# Patient Record
Sex: Male | Born: 1950
Health system: Southern US, Community
[De-identification: ages and names within clinical notes are randomized; demographics above are authoritative.]

## PROBLEM LIST (undated history)

## (undated) DIAGNOSIS — C801 Malignant (primary) neoplasm, unspecified: Secondary | ICD-10-CM

## (undated) DIAGNOSIS — G4730 Sleep apnea, unspecified: Secondary | ICD-10-CM

## (undated) DIAGNOSIS — K5790 Diverticulosis of intestine, part unspecified, without perforation or abscess without bleeding: Secondary | ICD-10-CM

## (undated) DIAGNOSIS — E785 Hyperlipidemia, unspecified: Secondary | ICD-10-CM

## (undated) DIAGNOSIS — N200 Calculus of kidney: Secondary | ICD-10-CM

## (undated) DIAGNOSIS — Z9989 Dependence on other enabling machines and devices: Secondary | ICD-10-CM

## (undated) DIAGNOSIS — M1990 Unspecified osteoarthritis, unspecified site: Secondary | ICD-10-CM

## (undated) DIAGNOSIS — H9190 Unspecified hearing loss, unspecified ear: Secondary | ICD-10-CM

## (undated) DIAGNOSIS — Z860101 Personal history of adenomatous and serrated colon polyps: Secondary | ICD-10-CM

## (undated) DIAGNOSIS — E119 Type 2 diabetes mellitus without complications: Secondary | ICD-10-CM

## (undated) DIAGNOSIS — F418 Other specified anxiety disorders: Secondary | ICD-10-CM

## (undated) DIAGNOSIS — K219 Gastro-esophageal reflux disease without esophagitis: Secondary | ICD-10-CM

## (undated) DIAGNOSIS — Z86010 Personal history of colonic polyps: Secondary | ICD-10-CM

## (undated) DIAGNOSIS — G4733 Obstructive sleep apnea (adult) (pediatric): Secondary | ICD-10-CM

## (undated) HISTORY — DX: Calculus of kidney: N20.0

## (undated) HISTORY — DX: Personal history of adenomatous and serrated colon polyps: Z86.0101

## (undated) HISTORY — DX: Hyperlipidemia, unspecified: E78.5

## (undated) HISTORY — DX: Obstructive sleep apnea (adult) (pediatric): G47.33

## (undated) HISTORY — DX: Malignant (primary) neoplasm, unspecified: C80.1

## (undated) HISTORY — DX: Other specified anxiety disorders: F41.8

## (undated) HISTORY — DX: Unspecified hearing loss, unspecified ear: H91.90

## (undated) HISTORY — PX: OTHER SURGICAL HISTORY: SHX169

## (undated) HISTORY — DX: Diverticulosis of intestine, part unspecified, without perforation or abscess without bleeding: K57.90

## (undated) HISTORY — DX: Type 2 diabetes mellitus without complications: E11.9

## (undated) HISTORY — PX: COLONOSCOPY: SHX174

## (undated) HISTORY — DX: Personal history of colonic polyps: Z86.010

## (undated) HISTORY — DX: Dependence on other enabling machines and devices: Z99.89

## (undated) HISTORY — DX: Sleep apnea, unspecified: G47.30

---

## 2000-06-03 ENCOUNTER — Ambulatory Visit (HOSPITAL_COMMUNITY): Admission: RE | Admit: 2000-06-03 | Discharge: 2000-06-03 | Payer: Self-pay | Admitting: Urology

## 2000-06-04 ENCOUNTER — Encounter: Payer: Self-pay | Admitting: Urology

## 2000-09-09 ENCOUNTER — Encounter: Payer: Self-pay | Admitting: Urology

## 2000-09-09 ENCOUNTER — Ambulatory Visit (HOSPITAL_COMMUNITY): Admission: RE | Admit: 2000-09-09 | Discharge: 2000-09-09 | Payer: Self-pay | Admitting: Urology

## 2003-05-26 DIAGNOSIS — Z8551 Personal history of malignant neoplasm of bladder: Secondary | ICD-10-CM

## 2003-05-26 HISTORY — PX: OTHER SURGICAL HISTORY: SHX169

## 2003-05-26 HISTORY — DX: Personal history of malignant neoplasm of bladder: Z85.51

## 2003-12-05 ENCOUNTER — Ambulatory Visit (HOSPITAL_BASED_OUTPATIENT_CLINIC_OR_DEPARTMENT_OTHER): Admission: RE | Admit: 2003-12-05 | Discharge: 2003-12-05 | Payer: Self-pay | Admitting: Urology

## 2003-12-05 ENCOUNTER — Encounter (INDEPENDENT_AMBULATORY_CARE_PROVIDER_SITE_OTHER): Payer: Self-pay | Admitting: Specialist

## 2003-12-05 ENCOUNTER — Ambulatory Visit (HOSPITAL_COMMUNITY): Admission: RE | Admit: 2003-12-05 | Discharge: 2003-12-05 | Payer: Self-pay | Admitting: Urology

## 2004-03-27 ENCOUNTER — Ambulatory Visit (HOSPITAL_COMMUNITY): Admission: RE | Admit: 2004-03-27 | Discharge: 2004-03-27 | Payer: Self-pay | Admitting: Internal Medicine

## 2004-09-18 ENCOUNTER — Ambulatory Visit: Payer: Self-pay | Admitting: Gastroenterology

## 2004-10-31 ENCOUNTER — Ambulatory Visit: Payer: Self-pay | Admitting: Gastroenterology

## 2009-10-08 ENCOUNTER — Encounter (INDEPENDENT_AMBULATORY_CARE_PROVIDER_SITE_OTHER): Payer: Self-pay | Admitting: *Deleted

## 2009-12-04 ENCOUNTER — Encounter (INDEPENDENT_AMBULATORY_CARE_PROVIDER_SITE_OTHER): Payer: Self-pay | Admitting: *Deleted

## 2009-12-06 ENCOUNTER — Ambulatory Visit: Payer: Self-pay | Admitting: Gastroenterology

## 2009-12-06 ENCOUNTER — Encounter (INDEPENDENT_AMBULATORY_CARE_PROVIDER_SITE_OTHER): Payer: Self-pay | Admitting: *Deleted

## 2009-12-20 ENCOUNTER — Ambulatory Visit: Payer: Self-pay | Admitting: Gastroenterology

## 2009-12-23 ENCOUNTER — Encounter: Payer: Self-pay | Admitting: Gastroenterology

## 2010-06-26 NOTE — Letter (Signed)
Summary: Colonoscopy Letter  [facility]  [address] [place], [place] [address]   Phone: [phone]  Fax: [phone]      Oct 08, 2009 MRN: [id number]   Ruben Logan [address] [place], [place]  [address]   Dear Ruben Logan,   According to your medical record, it is time for you to schedule a Colonoscopy. The American Cancer Society recommends this procedure as a method to detect early colon cancer. Patients with a family history of colon cancer, or a personal history of colon polyps or inflammatory bowel disease are at increased risk.  This letter has beeen generated based on the recommendations made at the time of your procedure. If you feel that in your particular situation this may no longer apply, please contact our office.  Please call our office at [phone] to schedule this appointment or to update your records at your earliest convenience.  Thank you for cooperating with [place] to provide you with the very best care possible.   Sincerely,  Judie Petit T. Russella Dar, M.D.  [facility] Gastroenterology Division [phone]

## 2010-06-26 NOTE — Letter (Signed)
Summary: Patient Notice- Polyp Results  [facility]  [address] [place], [place] [address]   Phone: [phone]  Fax: [phone]        December 23, 2009 MRN: [id number]    Ruben Logan [address] [place], [place]  [address]    Dear Ruben Logan,  I am pleased to inform you that the colon polyp(s) removed during your recent colonoscopy was (were) found to be benign (no cancer detected) upon pathologic examination.  I recommend you have a repeat colonoscopy examination in 5 years to look for recurrent polyps, as having colon polyps increases your risk for having recurrent polyps or even colon cancer in the future.  Should you develop new or worsening symptoms of abdominal pain, bowel habit changes or bleeding from the rectum or bowels, please schedule an evaluation with either your primary care physician or with me.  Continue treatment plan as outlined the day of your exam.  Please call us if you are having persistent problems or have questions about your condition that have not been fully answered at this time.  Sincerely,  Meryl Dare MD [facility]  This letter has been electronically signed by your physician.  Appended Document: Patient Notice- Polyp Results Letter mailed 8.3.2011

## 2010-06-26 NOTE — Letter (Signed)
Summary: Diabetic Instructions  [facility]  [address] [place], [place] [address]   Phone: [phone]  Fax: [phone]    ARVIND MEXICANO 1951/03/18 MRN: [id number]   _  _   ORAL DIABETIC MEDICATION INSTRUCTIONS  The day before your procedure:   Take your diabetic pill as you do normally  The day of your procedure:   Do not take your diabetic pill    We will check your blood sugar levels during the admission process and again in Recovery before discharging you home  ________________________________________________________________________

## 2010-06-26 NOTE — Procedures (Signed)
Summary: Colonoscopy  Patient: Ruben Logan Note: All result statuses are Final unless otherwise noted.  Tests: (1) Colonoscopy (COL)   COL Colonoscopy           DONE     [facility]     [address]     [place], [place]  [address]           COLONOSCOPY PROCEDURE REPORT           PATIENT:  Ruben Logan, Ruben Logan  MR#:  [id number]     BIRTHDATE:  1951-03-02, 59 yrs. old  GENDER:  male     ENDOSCOPIST:  Judie Petit T. Russella Dar, MD, [facility]           PROCEDURE DATE:  12/20/2009     PROCEDURE:  Colonoscopy with snare polypectomy     ASA CLASS:  Class II     INDICATIONS:  1) follow-up of polyp, adenomatous polyp, 09/2001. 2)     Screening and high risk surveillance     MEDICATIONS:   Fentanyl 75 mcg IV, Versed 8 mg IV     DESCRIPTION OF PROCEDURE:   After the risks benefits and     alternatives of the procedure were thoroughly explained, informed     consent was obtained.  Digital rectal exam was performed and     revealed no abnormalities.   The LB PCF-H180AL [id number] endoscope     was introduced through the anus and advanced to the cecum, which     was identified by both the appendix and ileocecal valve, without     limitations.  The quality of the prep was excellent, using     MoviPrep.  The instrument was then slowly withdrawn as the colon     was fully examined.     <<PROCEDUREIMAGES>>     FINDINGS:  A sessile polyp was found in the ascending colon. It     was 4 mm in size. Polyp was snared without cautery. Retrieval was     successful. A sessile polyp was found in the mid transverse colon.     It was 5 mm in size. Polyp was snared without cautery. Retrieval     was successful. Two polyps were found in the sigmoid colon. They     were 4 mm in size. Polyps were snared without cautery. Retrieval     was successful. Moderate diverticulosis was found in the sigmoid     to descending colon. This was otherwise a normal examination of     the colon. Retroflexed views in the rectum revealed  no     abnormalities. The time to cecum =  1.75  minutes. The scope was     then withdrawn (time =  10.5  min) from the patient and the     procedure completed.     COMPLICATIONS:  None           ENDOSCOPIC IMPRESSION:     1) 4 mm sessile polyp in the ascending colon     2) 5 mm sessile polyp in the mid transverse colon     3) 4 mm Two polyps in the sigmoid colon     4) Moderate diverticulosis in the sigmoid to descending colon     segments           RECOMMENDATIONS:     1) Await pathology results     2) High fiber diet with liberal fluid intake.     3) Repeat Colonoscopy in 5  years.           Judie Petit T. Russella Dar, MD, [facility]           CC: [name], MD           n.     Rosalie DoctorVenita Lick. Rondell Frick at 12/20/2009 09:00 AM           Jake Shark, [id number]  Note: An exclamation mark (!) indicates a result that was not dispersed into the flowsheet. Document Creation Date: 12/20/2009 9:00 AM _______________________________________________________________________  (1) Order result status: Final Collection or observation date-time: 12/20/2009 08:53 Requested date-time:  Receipt date-time:  Reported date-time:  Referring Physician:   Ordering Physician: Claudette Head [phone]) Specimen Source:  Source: Launa Grill Order Number: [id number] Lab site:   Appended Document: Colonoscopy     Procedures Next Due Date:    Colonoscopy: 11/2014

## 2010-06-26 NOTE — Letter (Signed)
Summary: [facility] Instructions  [facility]  [address] [place], [place] [address]   Phone: [phone]  Fax: [phone]       Ruben Logan    [birth year]    MRN: [id number]        Procedure Day Dorna Bloom:  Farrell Ours 12/20/2009     Arrival Time: 7:30 am      Procedure Time: 8:30 am     Location of Procedure:                    _ x_  [facility] (4th Floor)                        PREPARATION FOR COLONOSCOPY WITH MOVIPREP   Starting 5 days prior to your procedure Sunday 7/24 do not eat nuts, seeds, popcorn, corn, beans, peas,  salads, or any raw vegetables.  Do not take any fiber supplements (e.g. Metamucil, Citrucel, and Benefiber).  THE DAY BEFORE YOUR PROCEDURE         DATE: Thursday 7/28  1.  Drink clear liquids the entire day-NO SOLID FOOD  2.  Do not drink anything colored red or purple.  Avoid juices with pulp.  No orange juice.  3.  Drink at least 64 oz. (8 glasses) of fluid/clear liquids during the day to prevent dehydration and help the prep work efficiently.  CLEAR LIQUIDS INCLUDE: Water Jello Ice Popsicles Tea (sugar ok, no milk/cream) Powdered fruit flavored drinks Coffee (sugar ok, no milk/cream) Gatorade Juice: apple, white grape, white cranberry  Lemonade Clear bullion, consomm, broth Carbonated beverages (any kind) Strained chicken noodle soup Hard Candy                             4.  In the morning, mix first dose of MoviPrep solution:    Empty 1 Pouch A and 1 Pouch B into the disposable container    Add lukewarm drinking water to the top line of the container. Mix to dissolve    Refrigerate (mixed solution should be used within 24 hrs)  5.  Begin drinking the prep at 5:00 p.m. The MoviPrep container is divided by 4 marks.   Every 15 minutes drink the solution down to the next mark (approximately 8 oz) until the full liter is complete.   6.  Follow completed prep with 16 oz of clear liquid of your choice (Nothing  red or purple).  Continue to drink clear liquids until bedtime.  7.  Before going to bed, mix second dose of MoviPrep solution:    Empty 1 Pouch A and 1 Pouch B into the disposable container    Add lukewarm drinking water to the top line of the container. Mix to dissolve    Refrigerate  THE DAY OF YOUR PROCEDURE      DATE: Friday 7/29  Beginning at 3:30 a.m. (5 hours before procedure):         1. Every 15 minutes, drink the solution down to the next mark (approx 8 oz) until the full liter is complete.  2. Follow completed prep with 16 oz. of clear liquid of your choice.    3. You may drink clear liquids until 6:30 am (2 HOURS BEFORE PROCEDURE).   MEDICATION INSTRUCTIONS  Unless otherwise instructed, you should take regular prescription medications with a small sip of water   as early as possible the morning of  your procedure.  Diabetic patients - see separate instructions.          OTHER INSTRUCTIONS  You will need a responsible adult at least 62 years of age to accompany you and drive you home.   This person must remain in the waiting room during your procedure.  Wear loose fitting clothing that is easily removed.  Leave jewelry and other valuables at home.  However, you may wish to bring a book to read or  an iPod/MP3 player to listen to music as you wait for your procedure to start.  Remove all body piercing jewelry and leave at home.  Total time from sign-in until discharge is approximately 2-3 hours.  You should go home directly after your procedure and rest.  You can resume normal activities the  day after your procedure.  The day of your procedure you should not:   Drive   Make legal decisions   Operate machinery   Drink alcohol   Return to work  You will receive specific instructions about eating, activities and medications before you leave.    The above instructions have been reviewed and explained to me by   Ezra Sites RN  December 06, 2009  8:41 AM     I fully understand and can verbalize these instructions _____________________________ Date _________

## 2010-06-26 NOTE — Miscellaneous (Signed)
Summary: [facility]  Clinical Lists Changes  Medications: Added new medication of MOVIPREP 100 GM  SOLR (PEG-KCL-NACL-NASULF-NA ASC-C) As per prep instructions. - Signed Rx of MOVIPREP 100 GM  SOLR (PEG-KCL-NACL-NASULF-NA ASC-C) As per prep instructions.;  #1 x 0;  Signed;  Entered by: Ezra Sites RN;  Authorized by: Meryl Dare MD [facility];  Method used: [facility] [address] [phone]*, [address], [place], [place], [place]  [address], Ph: [phone] or [phone], Fax: [phone] Observations: Added new observation of NKA: T (12/06/2009 8:10)    Prescriptions: MOVIPREP 100 GM  SOLR (PEG-KCL-NACL-NASULF-NA ASC-C) As per prep instructions.  #1 x 0   Entered by:   Ezra Sites RN   Authorized by:   Meryl Dare MD [facility]   Signed by:   Ezra Sites RN on 12/06/2009   Method used:   [facility]        [facility] [address] [phone]* (retail)       [address]       [place], [place]  [address]       Ph: [phone] or [phone]       Fax: [phone]   RxID:   [id number]

## 2010-08-09 LAB — GLUCOSE, CAPILLARY
Glucose-Capillary: 143 mg/dL — ABNORMAL HIGH (ref 70–99)
Glucose-Capillary: 159 mg/dL — ABNORMAL HIGH (ref 70–99)

## 2010-10-10 NOTE — Op Note (Signed)
NAME:  Ruben Logan, Ruben Logan                         ACCOUNT NO.:  [id number]   MEDICAL RECORD NO.:  [id number]                   PATIENT TYPE:  AMB   LOCATION:  [facility]                                 FACILITY:  [facility]   PHYSICIAN:  Ronald L. Ovidio Hanger, M.D.           DATE OF BIRTH:  12-31-1950   DATE OF PROCEDURE:  12/05/2003  DATE OF DISCHARGE:                                 OPERATIVE REPORT   DIAGNOSES:  1. Right ureteral lithiasis.  2. Bladder tumor.   OPERATIVE PROCEDURE:  1. Cystourethroscopy.  2. Right ureteroscopy with balloon dilatation of right ureteral stricture.  3. Holmium laser lithotripsy of stone with basket stone extraction.  4. Placement of right double-J stent.  5. Biopsy of bladder lesion.   SURGEON:  Gaynelle Arabian.   ANESTHESIA:  LMA.   ESTIMATED BLOOD LOSS:  Negligible.   TUBES:  26 cm 6-French Cook double pigtail stent.   COMPLICATIONS:  None.   INDICATIONS FOR PROCEDURE:  Mr. Graves is a very nice 60 year old white male  who presented with right flank pain and dysuria.  He had had kidney stones  in the past and subsequently a CT urogram revealed an approximately 6 mm  right ureteral stone at the level of the vessels on the right side at the  pelvic brim at the sacroiliac joint.  After understanding risks, benefits  and alternatives, he would like to proceed with cysto, stone manipulation  and stent.   PROCEDURE IN DETAIL:  The patient was placed in the supine position.  After  proper LMA anesthesia, he was placed in the dorsal lithotomy position,  prepped and draped with Betadine in a sterile fashion.  A cystourethroscopy  was performed with a 22.5 [place] Olympus panendoscope.  Utilizing the 12 and  70 degree lenses, the bladder was carefully inspected.  There was mild  trilobar hypertrophy, the bladder was really smooth-walled.  Just proximal  and just lateral to the right ureteral orifice was a pedunculated polypoid  lesion suspicious for a very early  transitional cell carcinoma of the  bladder.  It was removed in its entirety with cold cut biopsy forceps,  submitted to pathology, it was approximately 3 mm in diameter.  The base was  cauterized with Bugbee coagulation cautery.  A 0.038 French Teflon-coated  guide wire was then inserted up to the right renal pelvis and the stone was  seen at the level of sacroiliac joint on the right side and was bypassed.  Initial attempt at ureteroscopy with short end [facility] ureteroscope revealed a  ureteral stricture approximately 1 cm above the ureteral orifice.  The scope  was removed and under fluoroscopic guidance a 10 mm, 5 mm ureteral balloon  dilator was passed and the area was dilated to 13 atmospheres for  approximately 3 minutes.  There was a waisting of the ureteral stricture  area but it eventually opened.  The balloon dilator was then removed  and  ureteroscopy was performed with short end Wolf ureteroscope and the stone  was visualized and noted to be somewhat impacted and quite large.  Utilizing  the 365 laser fiber with a repetition rate of 5, and thus set on 0.5, the  stone was serially fragmented and the fragments were serially extracted with  a nitinol basket and reinspection of the lower two-thirds ureter revealed no  significant fragments, no perforations and the ureteral stricture was widely  patent.  The ureteroscope was visually removed and under fluoroscopic  guidance a 26 cm 6 French Cook double pigtail stent was placed and noted to  be in good position within right renal pelvis, within the bladder, and a  pullout string was attached.  The bladder was drained, the scope was  removed.  Stones will be submitted for stone analysis.  The patient  tolerated the procedure well and was taken to the recovery room stable.                                               Ronald L. Ovidio Hanger, M.D.    RLD/MEDQ  D:  12/05/2003  T:  12/05/2003  Job:  [id number]

## 2012-05-25 HISTORY — PX: NM MYOVIEW LTD: HXRAD82

## 2012-06-17 ENCOUNTER — Other Ambulatory Visit (HOSPITAL_COMMUNITY): Payer: Self-pay | Admitting: Cardiology

## 2012-06-17 DIAGNOSIS — R9389 Abnormal findings on diagnostic imaging of other specified body structures: Secondary | ICD-10-CM

## 2012-06-17 DIAGNOSIS — I2119 ST elevation (STEMI) myocardial infarction involving other coronary artery of inferior wall: Secondary | ICD-10-CM

## 2012-06-22 ENCOUNTER — Encounter (HOSPITAL_COMMUNITY): Payer: Self-pay

## 2012-06-23 ENCOUNTER — Ambulatory Visit (HOSPITAL_COMMUNITY)
Admission: RE | Admit: 2012-06-23 | Discharge: 2012-06-23 | Disposition: A | Discharge status: Home / Self Care | Payer: 59 | Source: Ambulatory Visit | Attending: Cardiology | Admitting: Cardiology

## 2012-06-23 DIAGNOSIS — I2119 ST elevation (STEMI) myocardial infarction involving other coronary artery of inferior wall: Secondary | ICD-10-CM

## 2012-06-23 DIAGNOSIS — Z8249 Family history of ischemic heart disease and other diseases of the circulatory system: Secondary | ICD-10-CM | POA: Insufficient documentation

## 2012-06-23 DIAGNOSIS — I10 Essential (primary) hypertension: Secondary | ICD-10-CM | POA: Insufficient documentation

## 2012-06-23 DIAGNOSIS — R9389 Abnormal findings on diagnostic imaging of other specified body structures: Secondary | ICD-10-CM

## 2012-06-23 DIAGNOSIS — E119 Type 2 diabetes mellitus without complications: Secondary | ICD-10-CM | POA: Insufficient documentation

## 2012-06-23 MED ORDER — TECHNETIUM TC 99M SESTAMIBI GENERIC - CARDIOLITE
30.7000 | Freq: Once | INTRAVENOUS | Status: AC | PRN
  Administered 2012-06-23: 30.7 via INTRAVENOUS

## 2012-06-23 MED ORDER — TECHNETIUM TC 99M SESTAMIBI GENERIC - CARDIOLITE
10.7000 | Freq: Once | INTRAVENOUS | Status: AC | PRN
  Administered 2012-06-23: 11 via INTRAVENOUS

## 2012-06-23 NOTE — Procedures (Addendum)
[facility] [place] [facility] [address] [employer] [place] [address] [phone]  Cardiology Nuclear Med Study  Ruben Logan is a 62 y.o. male     MRN : [id number]     DOB: 05/11/1951  Procedure Date: 06/23/2012  Nuclear Med Background Indication for Stress Test:  Evaluation for Ischemia History:  Cardiac CT 1/12 suggestive of MI Cardiac Risk Factors: Family History - CAD, History of Smoking, Hypertension, Lipids, NIDDM and Overweight  Symptoms:  none   Nuclear Pre-Procedure Caffeine/Decaff Intake:  10:00pm NPO After: 8:00am   IV Site: R Hand  IV 0.9% NS with Angio Cath:  22g  Chest Size (in):  46 IV Started by: Koren Shiver, CNMT  Height: 5\' 11"  (1.803 m)  Cup Size: n/a  BMI:  Body mass index is 33.61 kg/(m^2). Weight:  241 lb (109.317 kg)   Tech Comments:  n/a    Nuclear Med Study 1 or 2 day study: 1 day  Stress Test Type:  Stress  Order Authorizing Provider:  Bryan Lemma, MD   Resting Radionuclide: Technetium 78m Sestamibi  Resting Radionuclide Dose: 10.7 mCi   Stress Radionuclide:  Technetium 30m Sestamibi  Stress Radionuclide Dose: 30.7 mCi           Stress Protocol Rest HR: 84 Stress HR: 166  Rest BP: 150/78 Stress BP: 196/83  Exercise Time (min): 4:50 METS: 6.70          Dose of Adenosine (mg):  n/a Dose of Lexiscan: n/a mg  Dose of Atropine (mg): n/a Dose of Dobutamine: n/a mcg/kg/min (at max HR)  Stress Test Technologist: Ernestene Mention, CCT Nuclear Technologist: Gonzella Lex, CNMT   Rest Procedure:  Myocardial perfusion imaging was performed at rest 45 minutes following the intravenous administration of Technetium 39m Sestamibi. Stress Procedure:  The patient performed treadmill exercise using a Bruce  Protocol for 4 minutes 50 seconds. The patient stopped due to fatigue and shortness and denied any chest pain.  There were no significant ST-T wave changes.  Technetium 4m Sestamibi was injected at peak exercise and  myocardial perfusion imaging was performed after a brief delay.  Transient Ischemic Dilatation (Normal <1.22): 0.79 Lung/Heart Ratio (Normal <0.45):  0.31 QGS EDV:  53 ml QGS ESV:  16 ml LV Ejection Fraction: 71%  Signed by     Rest ECG: NSR - Normal EKG  Stress ECG: No significant change from baseline ECG; rare isolated PVC  QPS Raw Data Images:  Normal; no motion artifact; normal heart/lung ratio. Stress Images:  Normal homogeneous uptake in all areas of the myocardium. Rest Images:  Normal homogeneous uptake in all areas of the myocardium. Subtraction (SDS):  Normal  Impression Exercise Capacity:  Fair exercise capacity. BP Response:  Hypertensive diastolic blood pressure response. Clinical Symptoms:  No significant symptoms noted. ECG Impression:  No significant ST segment change suggestive of ischemia. Comparison with Prior Nuclear Study: No previous nuclear study performed  Overall Impression:  Normal stress nuclear study.  Low risk stress nuclear study.  LV Wall Motion:  NL LV Function; NL Wall Motion   KELLY,Cederic A, MD  06/23/2012 1:40 PM

## 2012-09-24 ENCOUNTER — Encounter: Payer: Self-pay | Admitting: Cardiology

## 2013-12-29 ENCOUNTER — Encounter: Payer: Self-pay | Admitting: Neurology

## 2013-12-29 ENCOUNTER — Ambulatory Visit (INDEPENDENT_AMBULATORY_CARE_PROVIDER_SITE_OTHER): Payer: Medicare Other | Admitting: Neurology

## 2013-12-29 VITALS — BP 128/81 | HR 80 | Resp 17 | Ht 71.5 in | Wt 244.0 lb

## 2013-12-29 DIAGNOSIS — R400 Somnolence: Secondary | ICD-10-CM

## 2013-12-29 DIAGNOSIS — Z7189 Other specified counseling: Secondary | ICD-10-CM

## 2013-12-29 DIAGNOSIS — Z9989 Dependence on other enabling machines and devices: Secondary | ICD-10-CM

## 2013-12-29 DIAGNOSIS — R404 Transient alteration of awareness: Secondary | ICD-10-CM

## 2013-12-29 DIAGNOSIS — G4733 Obstructive sleep apnea (adult) (pediatric): Secondary | ICD-10-CM

## 2013-12-29 HISTORY — DX: Obstructive sleep apnea (adult) (pediatric): G47.33

## 2013-12-29 NOTE — Patient Instructions (Signed)

## 2013-12-29 NOTE — Progress Notes (Signed)
SLEEP MEDICINE CLINIC   Provider:  [name], M D  Referring Provider: No ref. provider found Primary Care Physician:  Jerlyn Ly, MD  CPAP compliance.  HPI:  Ruben Logan is a 63 y.o. male, who is seen here as a referral from [name] for a follow up on CPAP compliance,   This patient has been a patient of [facility] in 2010, and was followed last in 2011. The patient underwent a polysomnography study on 11-13-08 which documented an AHI of 45. This was considered a severe sleep apnea degree and he returned for a CPAP titration on 8/5-10 was titrated to 13 cm water with an end expiratory pressure relief of 2 centimeter and an AHI of 5.  Hypoxemia, which was noted during the first diagnostic study was corrected with CPAP. His heart rate still very widely at the time between 47 and 84 beats per minute the average heart rate was in the 57 beats per minute range. The patient has continued to use his CPAP reliably he actually told me he uses his CPAP religiously. After a recent visit with [facility] on care he was told that he will need a face-to-face evaluation by Dr.Nasiir Monts and an order signed by MD. He currently endorsing the Epworth sleepiness score at 4 points and the fatigue severity score at 15 points the geriatric depression score was endorsed at 0 points.  Current sleep habits; he goes to bed at 10, reads for one hour and falls asleep, he sleeps though the night , has one or none bathroom breaks. He had 3 times nocturia prior to CPAP.  He is retired now, rises at 7 AM and is refreshed and restored.  No morning headaches, only occasionally a dry mouth in the morning.   He is therefore now to be seen in a sleep medicine visit ,as he just was admitted to medicare and his DME needs a physicians face to face exam and orders. His download from [facility] and the one obtained in office today show a very high air-leak on nasal pillow. AHI 22.8 , user time is excellent at 8 hours and 8  minutes for 90 days , compliance is 98% on 12-28-13.  We will either chose an auto-titrator or offer a return titration, possible BiPAP for this patient,  The key is the reduction in airleak to obtain reliable AHI data. The patient's mask is old- used for 8 month.    Family history : son hat cardiac transplant.  Several grandchildren one with Deitricks syndrome.   Review of Systems: Out of a complete 14 system review, the patient complains of only the following symptoms, and all other reviewed systems are negative. He currently endorsing the Epworth sleepiness score at 4 points and the fatigue severity score at 15 points the geriatric depression score was endorsed at 0 points.  History   Social History  . Marital Status: Married    Spouse Name: Horris Latino    Number of Children: 1  . Years of Education: 14   Occupational History  . Not on file.   Social History Main Topics  . Smoking status: Former Smoker    Quit date: 03/16/2010  . Smokeless tobacco: Never Used  . Alcohol Use: No  . Drug Use: No  . Sexual Activity: Not on file   Other Topics Concern  . Not on file   Social History Narrative   Patient is married Horris Latino) and lives at home with his wife.   Patient has  one adult child.   Patient is retired Pharmacist, community)   Patient has a Financial risk analyst.   Patient is right-handed.   Patient drinks four cups of caffeine daily (coffee, soda and tea).    Family History  Problem Relation Age of Onset  . Heart disease Father   . Heart disease Son   . Diabetes Mother   . Diabetes Sister     Past Medical History  Diagnosis Date  . Kidney stones   . Bladder cancer   . Diabetes   . Depression with anxiety   . OSA on CPAP     Past Surgical History  Procedure Laterality Date  . Kidney stones    . Bladder cancer      No current outpatient prescriptions on file.   No current facility-administered medications for this visit.    Allergies as of 12/29/2013 - Review  Complete 12/29/2013  Allergen Reaction Noted  . Statins Itching 06/23/2012    Vitals: BP 128/81  Pulse 80  Resp 17  Ht 5' 11.5" (1.816 m)  Wt 244 lb (110.678 kg)  BMI 33.56 kg/m2 Last Weight:  Wt Readings from Last 1 Encounters:  12/29/13 244 lb (110.678 kg)       Last Height:   Ht Readings from Last 1 Encounters:  12/29/13 5' 11.5" (1.816 m)    Physical exam:  General: The patient is awake, alert and appears not in acute distress. The patient is well groomed. Head: Normocephalic, atraumatic. Neck is supple. Mallampati 3 ,  neck circumference:17.5 . Nasal airflow restricted, congested. , TMJ is not  evident . Retrognathia is mild seen.  Cardiovascular:  Regular rate and rhythm, without  murmurs or carotid bruit, and without distended neck veins. Respiratory: Lungs are clear to auscultation. Skin:  Without evidence of edema, or rash Trunk: BMI is elevated and patient  has normal posture.  Neurologic exam : The patient is awake and alert, oriented to place and time.   Memory subjective   described as intact. There is a normal attention span & concentration ability. Speech is fluent without  dysarthria, dysphonia or aphasia. Mood and affect are appropriate.  Cranial nerves: Pupils are equal and briskly reactive to light. Extraocular movements  in vertical and horizontal planes intact and without nystagmus. Visual fields by finger perimetry are intact. Hearing to finger rub intact.  Facial sensation intact to fine touch. Facial motor strength is symmetric and tongue and uvula move midline.  Motor exam:   Normal tone ,muscle bulk and symmetric ,strength in all extremities.  Sensory:  Fine touch, pinprick and vibration were tested in all extremities. Proprioception is normal.  Coordination: Rapid alternating movements in the fingers/hands is normal. Finger-to-nose maneuver  normal without evidence of ataxia, dysmetria or tremor.  Gait and station: Patient walks without assistive  device  Deep tendon reflexes: in the  upper and lower extremities are symmetric and intact. Babinski maneuver response is downgoing.   Assessment:  After physical and neurologic examination, review of laboratory studies, imaging, neurophysiology testing and pre-existing records, assessment is   8 patient with known severe OSA , AHI was 45 in 2010 , now needing new supplies.     The patient was advised of the nature of the diagnosed sleep disorder , the treatment options and risks for general a health and wellness arising from not treating the condition. Visit duration was  30 minutes.   Plan:  Treatment plan and additional workup : replace the air pillow and nasal  mask for this patient with mustache. The reduction in air-leak is needed.  If corrected, the AHI will follow.  The patient will be re-titrated to CPAP or BIPAP. Mask fitting by andy prior to return .      Asencion Partridge Courteney Alderete MD  12/29/2013

## 2014-01-18 ENCOUNTER — Encounter: Payer: Self-pay | Admitting: Gastroenterology

## 2014-02-07 ENCOUNTER — Ambulatory Visit (INDEPENDENT_AMBULATORY_CARE_PROVIDER_SITE_OTHER): Payer: Medicare Other | Admitting: Neurology

## 2014-02-07 DIAGNOSIS — Z7189 Other specified counseling: Secondary | ICD-10-CM

## 2014-02-07 DIAGNOSIS — R0989 Other specified symptoms and signs involving the circulatory and respiratory systems: Secondary | ICD-10-CM

## 2014-02-07 DIAGNOSIS — R400 Somnolence: Secondary | ICD-10-CM

## 2014-02-07 DIAGNOSIS — G4733 Obstructive sleep apnea (adult) (pediatric): Secondary | ICD-10-CM

## 2014-02-07 DIAGNOSIS — Z9989 Dependence on other enabling machines and devices: Secondary | ICD-10-CM

## 2014-02-07 DIAGNOSIS — R0609 Other forms of dyspnea: Secondary | ICD-10-CM

## 2014-02-16 ENCOUNTER — Other Ambulatory Visit: Payer: Self-pay | Admitting: Neurology

## 2014-02-16 ENCOUNTER — Telehealth: Payer: Self-pay | Admitting: *Deleted

## 2014-02-16 ENCOUNTER — Encounter: Payer: Self-pay | Admitting: Neurology

## 2014-02-16 DIAGNOSIS — G4733 Obstructive sleep apnea (adult) (pediatric): Secondary | ICD-10-CM

## 2014-02-16 NOTE — Telephone Encounter (Signed)
Patient was contacted and provided the results of his split night study performed on 02/07/2014.  Patient was informed that an order had been written for him to get new CPAP equipment and his established DME would be notified.  Patient was informed we would mail him a copy of his test results and a copy was sent to Dr. [name] Infante.  Patient was in agreement and informed a follow up visit would need to be schedule in 6-8 weeks.

## 2014-04-05 ENCOUNTER — Encounter: Payer: Self-pay | Admitting: Neurology

## 2014-06-20 ENCOUNTER — Ambulatory Visit (INDEPENDENT_AMBULATORY_CARE_PROVIDER_SITE_OTHER): Payer: 59 | Admitting: Neurology

## 2014-06-20 ENCOUNTER — Encounter: Payer: Self-pay | Admitting: Neurology

## 2014-06-20 VITALS — BP 133/83 | HR 81 | Resp 14 | Ht 71.75 in | Wt 243.0 lb

## 2014-06-20 DIAGNOSIS — Z9989 Dependence on other enabling machines and devices: Principal | ICD-10-CM

## 2014-06-20 DIAGNOSIS — G4733 Obstructive sleep apnea (adult) (pediatric): Secondary | ICD-10-CM

## 2014-06-20 NOTE — Progress Notes (Signed)
SLEEP MEDICINE CLINIC   Provider:  [name], M D  Referring Provider: Jerlyn Ly, MD Primary Care Physician:  Jerlyn Ly, MD  CPAP compliance.  HPI:  Ruben Logan is a 64 y.o. male, who is seen here as a referral from [name] for a follow up on CPAP compliance,    This patient has been a patient of [facility] since  2010,  whenunderwent a polysomnography study on 11-13-08  which documented an AHI of 45. This was considered a severe Degree of sleep apnea.  He returned for a CPAP titration on 8/5-10 and was titrated to 13 cm water with an end expiratory pressure relief of 2 centimeter and an AHI of 5.  Hypoxemia, which was noted during the first diagnostic study was corrected with CPAP. His heart rate still very widely at the time between 47 and 84 beats per minute the average heart rate was in the 57 beats per minute range. The patient has continued to use his CPAP reliably he actually told me he uses his CPAP religiously.  After a recent visit with [facility] on care he was told that he will need a face-to-face evaluation by Dr.Aislee Landgren and an order signed by MD. He currently endorsing the Epworth sleepiness score at 4 points and the fatigue severity score at 15 points the geriatric depression score was endorsed at 0 points.  Current sleep habits; he goes to bed at 10, reads for one hour and falls asleep, he sleeps though the night , has one or none bathroom breaks. He had 3 times nocturia prior to CPAP.  He is retired now, rises at 7 AM and is refreshed and restored. No morning headaches, only occasionally a dry mouth in the morning.  He is therefore now to be seen in a sleep medicine visit ,as he just was admitted to medicare and his DME needs a physicians face to face exam and orders. His download from [facility] and the one obtained in office today show a very high air-leak on nasal pillow. AHI 22.8 , user time is excellent at 8 hours and 8 minutes for 90 days , compliance is  98% on 12-28-13.  We will either chose an auto-titrator or offer a return titration, possible BiPAP for this patient, The key is the reduction in airleak to obtain reliable AHI data. The patient's mask is old- used for 8 month.  Family history : son hat cardiac transplant.   2 grandchildren one with Dietrich's  Syndrome, chromosome 54, followed at [facility].  His son  Ruben Logan has a inherited cerebellar ataxia, that has been exacerbated by organ transplant rejection drugs.  Dr Toy Care sees his son, who is bipolar.   06-20-14 [name] is seen here for his yearly routine revisit on 1-20 7-16 he is using CPAP compliantly has 100% compliance over the last 30 days at 100% compliance for over 4 hours of daily use. His average time of CPAP therapy is 9 hours and 5 minutes. This is today's record in my practice. The machine is set at 11 cm with 1 cm EPR his residual AHI is 7.1 which is a little higher than we like but may be related to an above average airleak. A routine replacement of the interface has helped to reduce some leakage and improve the AHI drastically. He struggled for about 3 weeks with an upper respiratory tract infection and was forced to mouth breathes during that time. He still has a very congested  nose.   Review of Systems: Out of a complete 14 system review, the patient complains of only the following symptoms, and all other reviewed systems are negative. He currently endorsing the Epworth sleepiness score at 8 points and the fatigue severity score at 29 points the geriatric depression score was endorsed at 0 points.  Current Outpatient Prescriptions  Medication Sig Dispense Refill  . aspirin 81 MG tablet Take 81 mg by mouth daily.    Marland Kitchen BYDUREON 2 MG SUSR   6  . cetirizine (ZYRTEC) 10 MG tablet Take 10 mg by mouth daily.    . clobetasol cream (TEMOVATE) 0.05 %   2  . colesevelam (WELCHOL) 625 MG tablet Take 625 mg by mouth. 6 pills per day    . Exenatide ER (BYDUREON) 2 MG PEN  Inject into the skin. Once a week.    . ezetimibe (ZETIA) 10 MG tablet Take 10 mg by mouth daily.    . famotidine (PEPCID) 20 MG tablet Take 20 mg by mouth daily.    . fexofenadine (ALLEGRA) 180 MG tablet Take 180 mg by mouth daily. As needed    . metFORMIN (GLUMETZA) 1000 MG (MOD) 24 hr tablet Take 1,000 mg by mouth daily with breakfast.    . PARoxetine (PAXIL) 20 MG tablet Take 20 mg by mouth daily.    . tamsulosin (FLOMAX) 0.4 MG CAPS capsule Take 0.4 mg by mouth 2 (two) times daily.     No current facility-administered medications for this visit.    Allergies as of 06/20/2014 - Review Complete 06/20/2014  Allergen Reaction Noted  . Statins Itching 06/23/2012    Vitals: BP 133/83 mmHg  Pulse 81  Resp 14  Ht 5' 11.75" (1.822 m)  Wt 243 lb (110.224 kg)  BMI 33.20 kg/m2 Last Weight:  Wt Readings from Last 1 Encounters:  06/20/14 243 lb (110.224 kg)       Last Height:   Ht Readings from Last 1 Encounters:  06/20/14 5' 11.75" (1.822 m)    Physical exam:  General: The patient is awake, alert and appears not in acute distress. The patient is well groomed. Head: Normocephalic, atraumatic. Neck is supple. Mallampati 3 ,  neck circumference:17.75 . Nasal airflow restricted, congested. Runny nose- upper airway infection.  , TMJ is not  evident . Retrognathia is mild seen.  Cardiovascular:  Regular rate and rhythm, without  murmurs or carotid bruit, and without distended neck veins. Respiratory:  Wheezing , just finished Z pack. Skin:  Without evidence of edema, or rash Trunk: BMI is elevated and patient  has normal posture.  Neurologic exam : The patient is awake and alert, oriented to place and time.   Memory subjective  described as intact. There is a normal attention span & concentration ability.  Speech is fluent, dysphonia   Mood and affect are appropriate.  Cranial nerves: Pupils are equal and briskly reactive to light. Extraocular movements  in vertical and horizontal  planes intact and without nystagmus. Visual fields by finger perimetry are intact. Hearing to finger rub intact.  Facial sensation intact to fine touch. Facial motor strength is symmetric and tongue and uvula move midline. Motor exam:   Normal tone, muscle bulk and symmetric, strength in all extremities. Sensory:  Fine touch, pinprick and vibration were tested in all extremities. Proprioception is normal. Coordination: Rapid alternating movements in the fingers/hands is normal. Finger-to-nose maneuver normal without evidence of ataxia, dysmetria or tremor. Gait and station: Patient walks without assistive device  Deep tendon reflexes: in the  upper and lower extremities are symmetric and intact. Babinski maneuver response is downgoing.   Assessment:  After physical and neurologic examination, review of laboratory studies, imaging, neurophysiology testing and pre-existing records,  15 minutes assessment is   1) patient with known severe OSA , AHI was 45 in 2010 , now  7.2 , needing new supplies.   2) Rv yearly   The patient was advised of the nature of the diagnosed sleep disorder , the treatment options and risks for general a health and wellness arising from not treating the condition. Visit duration was 15 minutes.         Asencion Partridge Briseyda Fehr MD  06/20/2014

## 2014-06-25 ENCOUNTER — Encounter: Payer: Self-pay | Admitting: Neurology

## 2014-10-11 ENCOUNTER — Encounter: Payer: Self-pay | Admitting: Gastroenterology

## 2015-01-23 ENCOUNTER — Encounter: Payer: Self-pay | Admitting: Cardiology

## 2015-06-21 ENCOUNTER — Ambulatory Visit: Payer: 59 | Admitting: Neurology

## 2015-06-24 ENCOUNTER — Ambulatory Visit (INDEPENDENT_AMBULATORY_CARE_PROVIDER_SITE_OTHER): Payer: Medicare Other | Admitting: Neurology

## 2015-06-24 ENCOUNTER — Encounter: Payer: Self-pay | Admitting: Neurology

## 2015-06-24 VITALS — BP 150/84 | HR 96 | Resp 20 | Ht 71.5 in | Wt 239.0 lb

## 2015-06-24 DIAGNOSIS — G4733 Obstructive sleep apnea (adult) (pediatric): Secondary | ICD-10-CM

## 2015-06-24 DIAGNOSIS — Z9989 Dependence on other enabling machines and devices: Principal | ICD-10-CM

## 2015-06-24 NOTE — Progress Notes (Signed)
SLEEP MEDICINE CLINIC   Provider:  [name], M D  Referring Provider: [name] Infante, MD Primary Care Physician:  Jerlyn Ly, MD  CPAP compliance.  HPI:  Ruben Logan is a 65 y.o. male, who is seen here as a referral from [name] for a follow up on CPAP compliance,    This patient has been a patient of [facility] since 2010, after he underwent a polysomnography study on 11-13-08 which documented an AHI of 45.  This was considered a severe Degree of sleep apnea.He returned for a CPAP titration on 8/5-10 and was titrated to 13 cm water with an end expiratory pressure relief of 2 centimeter and an AHI of 5.  Hypoxemia, which was noted during the first diagnostic study was corrected with CPAP. His heart rate still very widely at the time between 47 and 84 beats per minute the average heart rate was in the 57 beats per minute range. The patient has continued to use his CPAP reliably he actually told me he uses his CPAP religiously.  After a recent visit with advanced on care he was told that he will need a face-to-face evaluation by Dr.Treshon Stannard and an order signed by MD. He currently endorsing the Epworth sleepiness score at 4 points and the fatigue severity score at 15 points the geriatric depression score was endorsed at 0 points.  Current sleep habits; he goes to bed at 10, reads for one hour and falls asleep, he sleeps though the night , has one or none bathroom breaks. He had 3 times nocturia prior to CPAP.  He is retired now, rises at 7 AM and is refreshed and restored. No morning headaches, only occasionally a dry mouth in the morning.  He is therefore now to be seen in a sleep medicine visit ,as he just was admitted to medicare and his DME needs a physicians face to face exam and orders. His download from [facility] and the one obtained in office today show a very high air-leak on nasal pillow. AHI 22.8 , user time is excellent at 8 hours and 8 minutes for 90 days , compliance is  98% on 12-28-13.  We will either chose an auto-titrator or offer a return titration, possible BiPAP for this patient, The key is the reduction in airleak to obtain reliable AHI data. The patient's mask is old- used for 8 month.  Family history : son hat cardiac transplant.   2 grandchildren one with Dietrich's  Syndrome, chromosome 86, followed at [facility].  His son, [name], has a inherited cerebellar ataxia, that has been exacerbated by organ transplant rejection drugs.  Dr Toy Care sees his son, who is bipolar.  The Waltz's raise their 2 grandsons on the weeks their son has custody, and sometimes jason wouldn't even come over to visit them.    06-24-15 I see Ruben Logan today for his yearly scheduled CPAP compliance visit. He has continued to use his CPAP with great compliance 100% 4 days at 97% for 4 hours of daily use. Average user time is 8 hours and 30 minutes. The CPAP is set at 11 cm water was 1 cm EPR with his download showed an AHI of 8.8, which is elevated. Its much better than the 22.8 last year!  His weight hasn't changed.     06-20-14  Ruben Logan is seen here for his yearly routine revisit on 1-20 7-16 he is using CPAP compliantly has 100% compliance over the last 30 days at  100% compliance for over 4 hours of daily use. His average time of CPAP therapy is 9 hours and 5 minutes. This is today's record in my practice. The machine is set at 11 cm with 1 cm EPR his residual AHI is 7.1 which is a little higher than we like but may be related to an above average airleak. A routine replacement of the interface has helped to reduce some leakage and improve the AHI drastically. He struggled for about 3 weeks with an upper respiratory tract infection and was forced to mouth breathes during that time. He still has a very congested nose.   Review of Systems: Out of a complete 14 system review, the patient complains of only the following symptoms, and all other reviewed systems are  negative. He currently endorsing the Epworth sleepiness score at 6 from 8 points and the fatigue severity score at  25 from 29 points the geriatric depression score was endorsed at 0 points.   Current Outpatient Prescriptions  Medication Sig Dispense Refill  . aspirin 81 MG tablet Take 81 mg by mouth daily.    Marland Kitchen BYDUREON 2 MG SUSR   6  . cetirizine (ZYRTEC) 10 MG tablet Take 10 mg by mouth daily.    . clobetasol cream (TEMOVATE) 0.05 %   2  . colesevelam (WELCHOL) 625 MG tablet Take 625 mg by mouth. 6 pills per day    . Exenatide ER (BYDUREON) 2 MG PEN Inject into the skin. Once a week.    . ezetimibe (ZETIA) 10 MG tablet Take 10 mg by mouth daily.    . famotidine (PEPCID) 20 MG tablet Take 20 mg by mouth daily.    . fexofenadine (ALLEGRA) 180 MG tablet Take 180 mg by mouth daily. As needed    . metFORMIN (GLUMETZA) 1000 MG (MOD) 24 hr tablet Take 1,000 mg by mouth daily with breakfast.    . PARoxetine (PAXIL) 20 MG tablet Take 20 mg by mouth daily.    . tamsulosin (FLOMAX) 0.4 MG CAPS capsule Take 0.4 mg by mouth 2 (two) times daily.     No current facility-administered medications for this visit.    Allergies as of 06/24/2015 - Review Complete 06/24/2015  Allergen Reaction Noted  . Midazolam Other (See Comments) 06/24/2015  . Statins Itching 06/23/2012    Vitals: BP 150/84 mmHg  Pulse 96  Resp 20  Ht 5' 11.5" (1.816 m)  Wt 239 lb (108.41 kg)  BMI 32.87 kg/m2 Last Weight:  Wt Readings from Last 1 Encounters:  06/24/15 239 lb (108.41 kg)       Last Height:   Ht Readings from Last 1 Encounters:  06/24/15 5' 11.5" (1.816 m)    Physical exam:  General: The patient is awake, alert and appears not in acute distress. The patient is well groomed. Head: Normocephalic, atraumatic. Neck is supple. Mallampati 3 ,  neck circumference:17.75 . Nasal airflow restricted, congested. Runny nose- upper airway infection.  , TMJ is not  evident . Retrognathia is mild seen.   Cardiovascular:  Regular rate and rhythm, without  murmurs or carotid bruit, and without distended neck veins. Respiratory:  Wheezing , just finished Z pack. Skin:  Without evidence of edema, or rash Trunk: BMI is elevated and patient  has normal posture.  Neurologic exam : The patient is awake and alert, oriented to place and time.   Memory subjective  described as intact. There is a normal attention span & concentration ability.  Speech is fluent,  dysphonia   Mood and affect are appropriate.  Cranial nerves: Pupils are equal and briskly reactive to light. Extraocular movements  in vertical and horizontal planes intact and without nystagmus. Visual fields by finger perimetry are intact. Hearing to finger rub intact.  Facial sensation intact to fine touch. Facial motor strength is symmetric and tongue and uvula move midline. Motor exam:   Normal tone, muscle bulk and symmetric, strength in all extremities. Sensory:  Fine touch, pinprick and vibration were tested in all extremities. Proprioception is normal. Coordination: Rapid alternating movements in the fingers/hands is normal. Finger-to-nose maneuver normal without evidence of ataxia, dysmetria or tremor. Gait and station: Patient walks without assistive device  Deep tendon reflexes: in the upper and lower extremities are symmetric and intact. Babinski maneuver response is downgoing.   Assessment:  After physical and neurologic examination, review of laboratory studies, imaging, neurophysiology testing and pre-existing records,  15 minutes assessment is   1) patient with known severe OSA , AHI was 45 in 2010 , now  8.8 but feels well on it.  needing new supplies.   2) Rv yearly   The patient was advised of the nature of the diagnosed sleep disorder , the treatment options and risks for general a health and wellness arising from not treating the condition.  Visit duration was 15 minutes.  50% of the face to face time was dedicated to  the discussion of treatment adherence an coordination of care. He only goes once a night to the bathroom, Dr. Lawerance Bach is his Urologist.       [name] MD  06/24/2015   Cc Dr Joylene Draft. Dr Lawerance Bach.

## 2015-06-24 NOTE — Patient Instructions (Signed)

## 2015-11-01 ENCOUNTER — Encounter: Payer: Self-pay | Admitting: Gastroenterology

## 2015-12-24 ENCOUNTER — Ambulatory Visit (AMBULATORY_SURGERY_CENTER): Payer: Self-pay

## 2015-12-24 VITALS — Ht 71.0 in | Wt 241.0 lb

## 2015-12-24 DIAGNOSIS — Z86010 Personal history of colonic polyps: Secondary | ICD-10-CM

## 2015-12-24 MED ORDER — NA SULFATE-K SULFATE-MG SULF 17.5-3.13-1.6 GM/177ML PO SOLN: ORAL | 0 refills | Status: DC

## 2015-12-24 NOTE — Progress Notes (Signed)
Per pt, no allergies to soy or egg products.Pt not taking any weight loss meds or using  O2 at home. 

## 2016-01-07 ENCOUNTER — Encounter: Payer: Self-pay | Admitting: Gastroenterology

## 2016-01-21 ENCOUNTER — Ambulatory Visit (AMBULATORY_SURGERY_CENTER): Payer: Medicare Other | Admitting: Gastroenterology

## 2016-01-21 ENCOUNTER — Encounter: Payer: Self-pay | Admitting: Gastroenterology

## 2016-01-21 VITALS — BP 100/48 | HR 65 | Temp 98.6°F | Resp 17 | Ht 71.0 in | Wt 241.0 lb

## 2016-01-21 DIAGNOSIS — Z86010 Personal history of colonic polyps: Secondary | ICD-10-CM

## 2016-01-21 LAB — GLUCOSE, CAPILLARY
GLUCOSE-CAPILLARY: 161 mg/dL — AB (ref 65–99)
GLUCOSE-CAPILLARY: 118 mg/dL — AB (ref 65–99)

## 2016-01-21 MED ORDER — SODIUM CHLORIDE 0.9 % IV SOLN: 500.0000 mL | INTRAVENOUS | Status: DC

## 2016-01-21 NOTE — Patient Instructions (Signed)
YOU HAD AN ENDOSCOPIC PROCEDURE TODAY AT [facility]:   Refer to the procedure report that was given to you for any specific questions about what was found during the examination.  If the procedure report does not answer your questions, please call your gastroenterologist to clarify.  If you requested that your care partner not be given the details of your procedure findings, then the procedure report has been included in a sealed envelope for you to review at your convenience later.  YOU SHOULD EXPECT: Some feelings of bloating in the abdomen. Passage of more gas than usual.  Walking can help get rid of the air that was put into your GI tract during the procedure and reduce the bloating. If you had a lower endoscopy (such as a colonoscopy or flexible sigmoidoscopy) you may notice spotting of blood in your stool or on the toilet paper. If you underwent a bowel prep for your procedure, you may not have a normal bowel movement for a few days.  Please Note:  You might notice some irritation and congestion in your nose or some drainage.  This is from the oxygen used during your procedure.  There is no need for concern and it should clear up in a day or so.  SYMPTOMS TO REPORT IMMEDIATELY:   Following lower endoscopy (colonoscopy or flexible sigmoidoscopy):  Excessive amounts of blood in the stool  Significant tenderness or worsening of abdominal pains  Swelling of the abdomen that is new, acute  Fever of 100F or higher  For urgent or emergent issues, a gastroenterologist can be reached at any hour by calling [phone].   DIET:  We do recommend a small meal at first, but then you may proceed to your regular diet.  Drink plenty of fluids but you should avoid alcoholic beverages for 24 hours.  ACTIVITY:  You should plan to take it easy for the rest of today and you should NOT DRIVE or use heavy machinery until tomorrow (because of the sedation medicines used during the test).     PLease read all handouts given to you today by your recovery Rn. FOLLOW UP: Our staff will call the number listed on your records the next business day following your procedure to check on you and address any questions or concerns that you may have regarding the information given to you following your procedure. If we do not reach you, we will leave a message.  However, if you are feeling well and you are not experiencing any problems, there is no need to return our call.  We will assume that you have returned to your regular daily activities without incident.  If any biopsies were taken you will be contacted by phone or by letter within the next 1-3 weeks.  Please call us at [phone] if you have not heard about the biopsies in 3 weeks.    SIGNATURES/CONFIDENTIALITY: You and/or your care partner have signed paperwork which will be entered into your electronic medical record.  These signatures attest to the fact that that the information above on your After Visit Summary has been reviewed and is understood.  Full responsibility of the confidentiality of this discharge information lies with you and/or your care-partner.  Thank you for letting us take care of your healthcare needs today.

## 2016-01-21 NOTE — Progress Notes (Signed)
Patient awakening,vss,report to rn 

## 2016-01-21 NOTE — Op Note (Signed)
[facility] Patient Name: Ruben Logan Procedure Date: 01/21/2016 1:29 PM MRN: [id number] Endoscopist: Ladene Artist , MD Age: 65 Referring MD:  Date of Birth: 07-Feb-1951 Gender: Male Account #: [id number] Procedure:                Colonoscopy Indications:              Surveillance: Personal history of adenomatous                            polyps on last colonoscopy > 5 years ago Medicines:                Monitored Anesthesia Care Procedure:                Pre-Anesthesia Assessment:                           - Prior to the procedure, a History and Physical                            was performed, and patient medications and                            allergies were reviewed. The patient's tolerance of                            previous anesthesia was also reviewed. The risks                            and benefits of the procedure and the sedation                            options and risks were discussed with the patient.                            All questions were answered, and informed consent                            was obtained. Prior Anticoagulants: The patient has                            taken no previous anticoagulant or antiplatelet                            agents. ASA Grade Assessment: II - A patient with                            mild systemic disease. After reviewing the risks                            and benefits, the patient was deemed in                            satisfactory condition to undergo the procedure.  After obtaining informed consent, the colonoscope                            was passed under direct vision. Throughout the                            procedure, the patient's blood pressure, pulse, and                            oxygen saturations were monitored continuously. The                            Model PCF-[id number]) scope was introduced                            through the anus and  advanced to the the cecum,                            identified by appendiceal orifice and ileocecal                            valve. The ileocecal valve, appendiceal orifice,                            and rectum were photographed. The quality of the                            bowel preparation was good. The colonoscopy was                            performed without difficulty. The patient tolerated                            the procedure well. Scope In: 1:52:52 PM Scope Out: 2:03:07 PM Scope Withdrawal Time: 0 hours 8 minutes 42 seconds  Total Procedure Duration: 0 hours 10 minutes 15 seconds  Findings:                 Multiple small-mouthed diverticula were found in                            the sigmoid colon and descending colon. There was                            narrowing of the colon in association with the                            diverticular opening. There was evidence of                            diverticular spasm. There was no evidence of                            diverticular bleeding.  A few small-mouthed diverticula were found in the                            transverse colon. There was no evidence of                            diverticular bleeding.                           The exam was otherwise normal throughout the                            examined colon.                           The retroflexed view of the distal rectum and anal                            verge was normal and showed no anal or rectal                            abnormalities. Complications:            No immediate complications. Estimated blood loss:                            None. Estimated Blood Loss:     Estimated blood loss: none. Impression:               - Moderate diverticulosis in the sigmoid colon and                            in the descending colon.                           - Mild diverticulosis in the transverse colon.                            - The distal rectum and anal verge are normal on                            retroflexion view.                           - No specimens collected. Recommendation:           - Repeat colonoscopy in 5 years for surveillance.                           - Patient has a contact number available for                            emergencies. The signs and symptoms of potential                            delayed complications were discussed with the  patient. Return to normal activities tomorrow.                            Written discharge instructions were provided to the                            patient.                           - High fiber diet.                           - Continue present medications. Ladene Artist, MD 01/21/2016 2:12:01 PM This report has been signed electronically.

## 2016-01-22 ENCOUNTER — Telehealth: Payer: Self-pay

## 2016-01-22 NOTE — Telephone Encounter (Signed)
  Follow up Call-  Call back number 01/21/2016  Post procedure Call Back phone  # [phone]  Permission to leave phone message Yes  Some recent data might be hidden    Patient was called for follow up after his procedure on 01/21/2016. No answer at the number given for follow up phone call. A message was left on the answering machine.

## 2016-06-23 ENCOUNTER — Ambulatory Visit (INDEPENDENT_AMBULATORY_CARE_PROVIDER_SITE_OTHER): Payer: Medicare Other | Admitting: Adult Health

## 2016-06-23 ENCOUNTER — Encounter: Payer: Self-pay | Admitting: Adult Health

## 2016-06-23 VITALS — BP 131/70 | HR 80 | Ht 71.0 in | Wt 245.0 lb

## 2016-06-23 DIAGNOSIS — G4733 Obstructive sleep apnea (adult) (pediatric): Secondary | ICD-10-CM

## 2016-06-23 DIAGNOSIS — Z9989 Dependence on other enabling machines and devices: Secondary | ICD-10-CM

## 2016-06-23 NOTE — Patient Instructions (Signed)
Continue using CPAP nightly If your symptoms worsen or you develop new symptoms please let us know.   

## 2016-06-23 NOTE — Progress Notes (Signed)
PATIENT: Ruben Logan DOB: [birth year]  REASON FOR VISIT: follow up- OSA ON CPAP HISTORY FROM: patient  HISTORY OF PRESENT ILLNESS: Today 06/23/2016: Ruben Logan is a 66 year old male with a history of obstructive sleep apnea on CPAP. He returns today for a compliance download. His download indicates that he uses machine nightly for a compliance of 100%. With the exception of one night he uses machine greater than 4 hours every night. On average he uses his machine 8 hours and 50 minutes. His residual AHI is 2.4 on 11 cm water with EPR 1. The patient does have a slight leak. He reports that he  changes his supplies regularly. He reports that he does notice the mask may leak at times that he just adjusts it. He returns today for an evaluation.  HISTORY per Dr. Edwena Felty notes:Ruben Logan is a 67 y.o. male, who is seen here as a referral from [name] for a follow up on CPAP compliance,    This patient has been a patient of [facility] since 2010, after he underwent a polysomnography study on 11-13-08 which documented an AHI of 45.  This was considered a severe Degree of sleep apnea.He returned for a CPAP titration on 8/5-10 and was titrated to 13 cm water with an end expiratory pressure relief of 2 centimeter and an AHI of 5.  Hypoxemia, which was noted during the first diagnostic study was corrected with CPAP. His heart rate still very widely at the time between 47 and 84 beats per minute the average heart rate was in the 57 beats per minute range. The patient has continued to use his CPAP reliably he actually told me he uses his CPAP religiously.  After a recent visit with [facility] he was told that he will need a face-to-face evaluation by Dr.Dohmeier and an order signed by MD. He currently endorsing the Epworth sleepiness score at 4 points and the fatigue severity score at 15 points the geriatric depression score was endorsed at 0 points.  Current sleep habits; he goes to  bed at 10, reads for one hour and falls asleep, he sleeps though the night , has one or none bathroom breaks. He had 3 times nocturia prior to CPAP.  He is retired now, rises at 7 AM and is refreshed and restored. No morning headaches, only occasionally a dry mouth in the morning.  He is therefore now to be seen in a sleep medicine visit ,as he just was admitted to medicare and his DME needs a physicians face to face exam and orders. His download from [facility] and the one obtained in office today show a very high air-leak on nasal pillow. AHI 22.8 , user time is excellent at 8 hours and 8 minutes for 90 days , compliance is 98% on 12-28-13.  We will either chose an auto-titrator or offer a return titration, possible BiPAP for this patient, The key is the reduction in airleak to obtain reliable AHI data. The patient's mask is old- used for 8 month.  Family history : son hat cardiac transplant.   2 grandchildren one with Dietrich's  Syndrome, chromosome 21, followed at [facility].  His son, [name], has a inherited cerebellar ataxia, that has been exacerbated by organ transplant rejection drugs.  Dr Toy Care sees his son, who is bipolar.  The Granade's raise their 2 grandsons on the weeks their son has custody, and sometimes jason wouldn't even come over to visit them.  06-24-15 I see Ruben Logan today for his yearly scheduled CPAP compliance visit. He has continued to use his CPAP with great compliance 100% 4 days at 97% for 4 hours of daily use. Average user time is 8 hours and 30 minutes. The CPAP is set at 11 cm water was 1 cm EPR with his download showed an AHI of 8.8, which is elevated. Its much better than the 22.8 last year!  His weight hasn't changed.     06-20-14  Ruben Logan is seen here for his yearly routine revisit on 1-20 7-16 he is using CPAP compliantly has 100% compliance over the last 30 days at 100% compliance for over 4 hours of daily use. His average time of CPAP therapy is 9 hours  and 5 minutes. This is today's record in my practice. The machine is set at 11 cm with 1 cm EPR his residual AHI is 7.1 which is a little higher than we like but may be related to an above average airleak. A routine replacement of the interface has helped to reduce some leakage and improve the AHI drastically. He struggled for about 3 weeks with an upper respiratory tract infection and was forced to mouth breathes during that time. He still has a very congested nose.    REVIEW OF SYSTEMS: Out of a complete 14 system review of symptoms, the patient complains only of the following symptoms, and all other reviewed systems are negative.  See history of present illness  ALLERGIES: Allergies  Allergen Reactions  . Midazolam Other (See Comments)  . Statins Itching    HOME MEDICATIONS: Outpatient Medications Prior to Visit  Medication Sig Dispense Refill  . aspirin 81 MG tablet Take 81 mg by mouth daily.    Marland Kitchen BYDUREON 2 MG SUSR once a week.   6  . cetirizine (ZYRTEC) 10 MG tablet Take 10 mg by mouth daily.    . clobetasol cream (TEMOVATE) 0.05 % as needed.   2  . colesevelam (WELCHOL) 625 MG tablet Take 625 mg by mouth. 6 pills per day    . ezetimibe (ZETIA) 10 MG tablet Take 10 mg by mouth daily.    . famotidine (PEPCID) 20 MG tablet Take 20 mg by mouth daily.    . fexofenadine (ALLEGRA) 180 MG tablet Take 180 mg by mouth daily. As needed    . metFORMIN (GLUMETZA) 1000 MG (MOD) 24 hr tablet Take 1,000 mg by mouth daily with breakfast.    . PARoxetine (PAXIL) 20 MG tablet Take 20 mg by mouth daily.    . tamsulosin (FLOMAX) 0.4 MG CAPS capsule Take 0.4 mg by mouth 2 (two) times daily.    . Multiple Vitamin (MULTIVITAMIN) tablet Take 1 tablet by mouth daily. Centrum MVI-Take one daily    . vitamin B-12 (CYANOCOBALAMIN) 1000 MCG tablet Take 1,000 mcg by mouth daily.     Facility-Administered Medications Prior to Visit  Medication Dose Route Frequency Provider Last Rate Last Dose  . 0.9 %   sodium chloride infusion  500 mL Intravenous Continuous Ladene Artist, MD        PAST MEDICAL HISTORY: Past Medical History:  Diagnosis Date  . Cancer [facility])    Bladder cancer 2005  . Depression with anxiety   . Diabetes ([place])   . Hyperlipidemia   . Kidney stones   . OSA on CPAP   . OSA on CPAP 12/29/2013  . Sleep apnea    wears CPAP    PAST SURGICAL HISTORY:  Past Surgical History:  Procedure Laterality Date  . bladder cancer    . COLONOSCOPY    . kidney stones      FAMILY HISTORY: Family History  Problem Relation Age of Onset  . Heart disease Father   . Diabetes Mother   . Heart disease Son   . Diabetes Sister   . Colon cancer Neg Hx   . Esophageal cancer Neg Hx   . Stomach cancer Neg Hx   . Rectal cancer Neg Hx     SOCIAL HISTORY: Social History   Social History  . Marital status: Married    Spouse name: Horris Latino  . Number of children: 1  . Years of education: 34   Occupational History  . Not on file.   Social History Main Topics  . Smoking status: Former Smoker    Quit date: 03/16/2010  . Smokeless tobacco: Never Used  . Alcohol use No  . Drug use: No  . Sexual activity: Not on file   Other Topics Concern  . Not on file   Social History Narrative   Patient is married Horris Latino) and lives at home with his wife.   Patient has one adult child.   Patient is retired Pharmacist, community)   Patient has a Financial risk analyst.   Patient is right-handed.   Patient drinks four cups of caffeine daily (coffee, soda and tea).      PHYSICAL EXAM  Vitals:   06/23/16 0838  BP: 131/70  Pulse: 80  Weight: 245 lb (111.1 kg)  Height: 5\' 11"  (1.803 m)   Body mass index is 34.17 kg/m.  Generalized: Well developed, in no acute distress   Neurological examination  Mentation: Alert oriented to time, place, history taking. Follows all commands speech and language fluent Cranial nerve II-XII: Pupils were equal round reactive to light. Extraocular movements were  full, visual field were full on confrontational test. Facial sensation and strength were normal. Uvula tongue midline. Head turning and shoulder shrug  were normal and symmetric. Motor: The motor testing reveals 5 over 5 strength of all 4 extremities. Good symmetric motor tone is noted throughout.  Sensory: Sensory testing is intact to soft touch on all 4 extremities. No evidence of extinction is noted.  Coordination: Cerebellar testing reveals good finger-nose-finger and heel-to-shin bilaterally.  Gait and station: Gait is normal. Reflexes: Deep tendon reflexes are symmetric and normal bilaterally.   DIAGNOSTIC DATA (LABS, IMAGING, TESTING) - I reviewed patient records, labs, notes, testing and imaging myself where available.  No results found for: WBC, HGB, HCT, MCV, PLT No results found for: NA, K, CL, CO2, GLUCOSE, BUN, CREATININE, CALCIUM, PROT, ALBUMIN, AST, ALT, ALKPHOS, BILITOT, GFRNONAA, GFRAA No results found for: CHOL, HDL, LDLCALC, LDLDIRECT, TRIG, CHOLHDL No results found for: HGBA1C No results found for: VITAMINB12 No results found for: TSH    ASSESSMENT AND PLAN 66 y.o. year old male  has a past medical history of Cancer ([place]); Depression with anxiety; Diabetes ([place]); Hyperlipidemia; Kidney stones; OSA on CPAP; OSA on CPAP (12/29/2013); and Sleep apnea. here with:  1. Obstructive sleep apnea on CPAP  Overall the patient is doing well. He has excellent compliance with good treatment of his apnea. He denies any new neurological symptoms. He returns today for an evaluation.   I spent 15 minutes with the patient 50% of this time was reviewing his compliance report. Also discussed with the patient risk factors of sleep apnea and risk factors associated with un treated sleep apnea.  Ward Givens, MSN, NP-C 06/23/2016, 9:06 AM [facility] [address] [place], [place] [address] [phone]

## 2016-06-23 NOTE — Progress Notes (Signed)
I agree with the assessment and plan as directed by NP .The patient is known to me .   Sibel Khurana, MD  

## 2017-02-01 ENCOUNTER — Other Ambulatory Visit: Payer: Self-pay | Admitting: Urology

## 2017-02-01 DIAGNOSIS — N200 Calculus of kidney: Secondary | ICD-10-CM

## 2017-02-05 ENCOUNTER — Ambulatory Visit
Admission: RE | Admit: 2017-02-05 | Discharge: 2017-02-05 | Disposition: A | Discharge status: Home / Self Care | Payer: Medicare Other | Source: Ambulatory Visit | Attending: Urology | Admitting: Urology

## 2017-02-05 DIAGNOSIS — N200 Calculus of kidney: Secondary | ICD-10-CM

## 2017-02-26 ENCOUNTER — Other Ambulatory Visit: Payer: Self-pay | Admitting: Urology

## 2017-02-26 DIAGNOSIS — N200 Calculus of kidney: Secondary | ICD-10-CM

## 2017-03-31 ENCOUNTER — Ambulatory Visit
Admission: RE | Admit: 2017-03-31 | Discharge: 2017-03-31 | Disposition: A | Discharge status: Home / Self Care | Payer: Medicare Other | Source: Ambulatory Visit | Attending: Urology | Admitting: Urology

## 2017-03-31 DIAGNOSIS — N200 Calculus of kidney: Secondary | ICD-10-CM

## 2017-06-27 ENCOUNTER — Encounter: Payer: Self-pay | Admitting: Neurology

## 2017-06-28 ENCOUNTER — Encounter: Payer: Self-pay | Admitting: Neurology

## 2017-06-28 ENCOUNTER — Ambulatory Visit: Payer: Medicare Other | Admitting: Neurology

## 2017-06-28 VITALS — BP 129/78 | HR 79 | Ht 71.0 in | Wt 232.0 lb

## 2017-06-28 DIAGNOSIS — G4733 Obstructive sleep apnea (adult) (pediatric): Secondary | ICD-10-CM | POA: Diagnosis not present

## 2017-06-28 DIAGNOSIS — Z9989 Dependence on other enabling machines and devices: Secondary | ICD-10-CM

## 2017-06-28 NOTE — Progress Notes (Signed)
[facility]   Provider:  Larey Logan, M D  Referring Provider: [name] Infante, MD Primary Logan Physician:  Ruben Infante, MD  CPAP compliance.  HPI:  Ruben Logan is a 67 y.o. male, who is seen here as a referral from Dr. Joylene Logan for a follow up on CPAP compliance,    This patient has been a patient of Ruben Logan since 2010, after he underwent a polysomnography study on 11-13-08 which documented an AHI of 45. This was considered a severe Degree of Logan apnea.He returned for a CPAP titration on 8/5-10 and was titrated to 13 cm water with an end expiratory pressure relief of 2 centimeter and an AHI of 5.  Hypoxemia, which was noted during Ruben first diagnostic study was corrected with CPAP. His heart rate still very widely at Ruben time between 47 and 84 beats per minute Ruben average heart rate was in Ruben 57 beats per minute range. Ruben patient has continued to use his CPAP reliably he actually told me he uses his CPAP religiously.  After a recent visit with Ruben Logan he was told that he will need a face-to-face evaluation by Ruben Logan and an order signed by MD. He currently endorsing Ruben Epworth sleepiness score at 4 points and Ruben fatigue severity score at 15 points Ruben geriatric depression score was endorsed at 0 points.  Current Logan habits; he goes to bed at 10, reads for one hour and falls asleep, he sleeps though Ruben night , has one or none bathroom breaks. He had 3 times nocturia prior to CPAP.  He is retired now, rises at 7 AM and is refreshed and restored. No morning headaches, only occasionally a dry mouth in Ruben morning.  He is therefore now to be seen in a Logan medicine visit ,as he just was admitted to medicare and his DME needs a physicians face to face exam and orders. His download from Ruben Logan and Ruben one obtained in office today show a very high air-leak on nasal pillow. AHI 22.8 , user time is excellent at 8 hours and 8 minutes for 90 days , compliance is 98%  on 12-28-13.  We will either chose an auto-titrator or offer a return titration, possible BiPAP for this patient, Ruben key is Ruben reduction in airleak to obtain reliable AHI data. Ruben patient's mask is old- used for 8 month.  Family history : son hat cardiac transplant.   2 grandchildren one with Ruben Logan's  Syndrome, chromosome 86, followed at Ruben [facility].  His son, Ruben Logan, has a inherited cerebellar ataxia, that has been exacerbated by organ transplant rejection drugs.  Dr Ruben Logan sees his son, who is bipolar.  Ruben Logan raise their 2 grandsons on Ruben weeks their son has custody, and sometimes Ruben Logan wouldn't even come over to visit them.   06-24-15 I see Ruben Logan today for his yearly scheduled CPAP compliance visit. He has continued to use his CPAP with great compliance 100% 4 days at 97% for 4 hours of daily use. Average user time is 8 hours and 30 minutes. Ruben CPAP is set at 11 cm water was 1 cm EPR with his download showed an AHI of 8.8, which is elevated. Its much better than Ruben 22.8 last year!  His weight hasn't changed.   06-20-14 Ruben Logan is seen here for his yearly routine revisit on 1-20 7-16 he is using CPAP compliantly has 100% compliance over Ruben last 30 days at 100% compliance for over 4  hours of daily use. His average time of CPAP therapy is 9 hours and 5 minutes. This is today's record in my practice. Ruben machine is set at 11 cm with 1 cm EPR his residual AHI is 7.1 which is a little higher than we like but may be related to an above average airleak. A routine replacement of Ruben interface has helped to reduce some leakage and improve Ruben AHI drastically. He struggled for about 3 weeks with an upper respiratory tract infection and was forced to mouth breathes during that time. He still has a very congested nose.  06-23-2016, meeting with Ruben Logan.   06-28-2017, RV with Ruben Logan, Ruben Logan is seen here today for a compliance visit and he is a very compliant and established CPAP user.  He  has 100% compliance for Ruben last 30 days average use of time 8 hours 44 minutes, set pressure of 11 cmH2O was 1 cm EPR and a residual AHI of 3.9.  He is still followed by Ruben home Logan, his machine is not auto titration capable.  It also does not tell me if his residual apneas are obstructive or central in nature.  His 95th percentile air leak is 30.7 L but seems not to have led to an increase in apneas or hypopneas.  He uses very little humidification actually keeps at cool.  He uses a nasal mask.         Review of Systems: Out of a complete 14 system review, Ruben patient complains of only Ruben following symptoms, and all other reviewed systems are negative. He currently endorsing Ruben Epworth sleepiness score at 5 from 8 points pre CPAP , and Ruben fatigue severity score at 19 from 29 points Ruben geriatric depression score - No more nocturia, excellent Logan quality.    Current Outpatient Medications  Medication Sig Dispense Refill  . aspirin 81 MG tablet Take 81 mg by mouth daily.    Marland Kitchen b complex vitamins tablet Take 1 tablet by mouth daily.    Marland Kitchen BYDUREON 2 MG SUSR once a week.   6  . cetirizine (ZYRTEC) 10 MG tablet Take 10 mg by mouth daily.    . clobetasol cream (TEMOVATE) 0.05 % as needed.   2  . colesevelam (WELCHOL) 625 MG tablet Take 625 mg by mouth. 6 pills per day    . ezetimibe (ZETIA) 10 MG tablet Take 10 mg by mouth daily.    . famotidine (PEPCID) 20 MG tablet Take 20 mg by mouth daily.    . fexofenadine (ALLEGRA) 180 MG tablet Take 180 mg by mouth daily. As needed    . metFORMIN (GLUMETZA) 1000 MG (MOD) 24 hr tablet Take 1,000 mg by mouth daily with breakfast.    . OVER Ruben COUNTER MEDICATION 10 mg daily. CBD    . PARoxetine (PAXIL) 20 MG tablet Take 20 mg by mouth daily.    . tamsulosin (FLOMAX) 0.4 MG CAPS capsule Take 0.4 mg by mouth 2 (two) times daily.     Current Facility-Administered Medications  Medication Dose Route Frequency Provider Last Rate Last Dose  . 0.9  %  sodium chloride infusion  500 mL Intravenous Continuous Ladene Artist, MD        Allergies as of 06/28/2017 - Review Complete 06/28/2017  Allergen Reaction Noted  . Midazolam Other (See Comments) 06/24/2015  . Statins Itching 06/23/2012    Vitals: BP 129/78   Pulse 79   Ht 5\' 11"  (1.803 m)   Wt  232 lb (105.2 kg)   BMI 32.36 kg/m  Last Weight:  Wt Readings from Last 1 Encounters:  06/28/17 232 lb (105.2 kg)       Last Height:   Ht Readings from Last 1 Encounters:  06/28/17 5\' 11"  (1.803 m)    Physical exam:  General: Ruben patient is awake, alert and appears not in acute distress. Ruben patient is well groomed. Head: Normocephalic, atraumatic. Neck is supple. Mallampati 3 ,  neck circumference:17.75 . Nasal airflow restricted, congested. Runny nose- upper airway infection.  .  Cardiovascular:  Regular rate and rhythm, without  murmurs or carotid bruit, and without distended neck veins. Respiratory:  Wheezing, just finished Z pack. Skin:  Without evidence of edema, or rash. Trunk: BMI is elevated at 32.  Neurologic exam : Ruben patient is awake and alert, oriented to place and time.   Memory subjective  described as intact. There is a normal attention span & concentration ability.  Speech is fluent, dysphonia  Mood and affect are appropriate.  Cranial nerves: Pupils are equal and briskly reactive to light. Extraocular movements  in vertical and horizontal planes intact and without nystagmus. Visual fields by finger perimetry are intact- Facial sensation intact to fine touch. Facial motor strength is symmetric and tongue and uvula move midline. Motor exam:  Normal tone, muscle bulk and symmetric strength in all 4 extremities. Sensory:  Fine touch, pinprick and vibration were tested in all extremities. Rapid alternating movements in Ruben fingers/hands is normal. Finger-to-nose maneuver normal without evidence of ataxia, dysmetria or tremor. Gait and station: Patient walks  without assistive device  Deep tendon reflexes: in Ruben upper and lower extremities are symmetric and intact. Babinski maneuver response is downgoing.  Assessment:  After physical and neurologic examination, review of laboratory studies, imaging, neurophysiology testing and pre-existing records,  15 minutes assessment is   1) patient with known severe OSA , AHI was 45 in 2010 , now  3.9 on CPAP 11 cm water - feels well on it. Needing new supplies.   2) Rv yearly with MD alternating with Logan.   Ruben patient was advised of Ruben nature of Ruben diagnosed Logan disorder, Ruben treatment options and risks for general a Logan and wellness arising from not treating Ruben condition. Visit duration was 15 minutes.  50% of Ruben face to face time was dedicated to Ruben discussion of treatment adherence an coordination of Logan.     Asencion Partridge Sanyla Summey MD  06/28/2017   Cc Dr Ruben Logan. CC: Dr. Lawerance Bach is his Urologist.

## 2017-06-28 NOTE — Patient Instructions (Signed)

## 2017-08-18 ENCOUNTER — Ambulatory Visit
Admission: RE | Admit: 2017-08-18 | Discharge: 2017-08-18 | Disposition: A | Discharge status: Home / Self Care | Payer: Medicare Other | Source: Ambulatory Visit | Attending: Internal Medicine | Admitting: Internal Medicine

## 2017-08-18 ENCOUNTER — Other Ambulatory Visit: Payer: Self-pay | Admitting: Internal Medicine

## 2017-08-18 DIAGNOSIS — R197 Diarrhea, unspecified: Secondary | ICD-10-CM

## 2017-08-18 DIAGNOSIS — R112 Nausea with vomiting, unspecified: Secondary | ICD-10-CM

## 2018-06-28 ENCOUNTER — Ambulatory Visit: Payer: Medicare Other | Admitting: Neurology

## 2018-06-28 ENCOUNTER — Encounter: Payer: Self-pay | Admitting: Neurology

## 2018-06-28 ENCOUNTER — Telehealth: Payer: Self-pay | Admitting: Neurology

## 2018-06-28 VITALS — BP 126/62 | HR 82 | Ht 71.0 in | Wt 227.0 lb

## 2018-06-28 DIAGNOSIS — G4733 Obstructive sleep apnea (adult) (pediatric): Secondary | ICD-10-CM | POA: Diagnosis not present

## 2018-06-28 DIAGNOSIS — Z9989 Dependence on other enabling machines and devices: Secondary | ICD-10-CM

## 2018-06-28 DIAGNOSIS — I10 Essential (primary) hypertension: Secondary | ICD-10-CM | POA: Insufficient documentation

## 2018-06-28 DIAGNOSIS — Z6835 Body mass index (BMI) 35.0-35.9, adult: Secondary | ICD-10-CM

## 2018-06-28 NOTE — Patient Instructions (Signed)
CPAP and BPAP Information  CPAP and BPAP are methods of helping a person breathe with the use of air pressure. CPAP stands for "continuous positive airway pressure." BPAP stands for "bi-level positive airway pressure." In both methods, air is blown through your nose or mouth and into your air passages to help you breathe well.  CPAP and BPAP use different amounts of pressure to blow air. With CPAP, the amount of pressure stays the same while you breathe in and out. With BPAP, the amount of pressure is increased when you breathe in (inhale) so that you can take larger breaths. Your health care provider will recommend whether CPAP or BPAP would be more helpful for you.  Why are CPAP and BPAP treatments used?  CPAP or BPAP can be helpful if you have:  · Sleep apnea.  · Chronic obstructive pulmonary disease (COPD).  · Heart failure.  · Medical conditions that weaken the muscles of the chest including muscular dystrophy, or neurological diseases such as amyotrophic lateral sclerosis (ALS).  · Other problems that cause breathing to be weak, abnormal, or difficult.  CPAP is most commonly used for obstructive sleep apnea (OSA) to keep the airways from collapsing when the muscles relax during sleep.  How is CPAP or BPAP administered?  Both CPAP and BPAP are provided by a small machine with a flexible plastic tube that attaches to a plastic mask. You wear the mask. Air is blown through the mask into your nose or mouth. The amount of pressure that is used to blow the air can be adjusted on the machine. Your health care provider will determine the pressure setting that should be used based on your individual needs.  When should CPAP or BPAP be used?  In most cases, the mask only needs to be worn during sleep. Generally, the mask needs to be worn throughout the night and during any daytime naps. People with certain medical conditions may also need to wear the mask at other times when they are awake. Follow instructions from your  health care provider about when to use the machine.  What are some tips for using the mask?    · Because the mask needs to be snug, some people feel trapped or closed-in (claustrophobic) when first using the mask. If you feel this way, you may need to get used to the mask. One way to do this is by holding the mask loosely over your nose or mouth and then gradually applying the mask more snugly. You can also gradually increase the amount of time that you use the mask.  · Masks are available in various types and sizes. Some fit over your mouth and nose while others fit over just your nose. If your mask does not fit well, talk with your health care provider about getting a different one.  · If you are using a mask that fits over your nose and you tend to breathe through your mouth, a chin strap may be applied to help keep your mouth closed.  · The CPAP and BPAP machines have alarms that may sound if the mask comes off or develops a leak.  · If you have trouble with the mask, it is very important that you talk with your health care provider about finding a way to make the mask easier to tolerate. Do not stop using the mask. Stopping the use of the mask could have a negative impact on your health.  What are some tips for using the machine?  ·   Place your CPAP or BPAP machine on a secure table or stand near an electrical outlet.  · Know where the on/off switch is located on the machine.  · Follow instructions from your health care provider about how to set the pressure on your machine and when you should use it.  · Do not eat or drink while the CPAP or BPAP machine is on. Food or fluids could get pushed into your lungs by the pressure of the CPAP or BPAP.  · Do not smoke. Tobacco smoke residue can damage the machine.  · For home use, CPAP and BPAP machines can be rented or purchased through home health care companies. Many different brands of machines are available. Renting a machine before purchasing may help you find out  which particular machine works well for you.  · Keep the CPAP or BPAP machine and attachments clean. Ask your health care provider for specific instructions.  Get help right away if:  · You have redness or open areas around your nose or mouth where the mask fits.  · You have trouble using the CPAP or BPAP machine.  · You cannot tolerate wearing the CPAP or BPAP mask.  · You have pain, discomfort, and bloating in your abdomen.  Summary  · CPAP and BPAP are methods of helping a person breathe with the use of air pressure.  · Both CPAP and BPAP are provided by a small machine with a flexible plastic tube that attaches to a plastic mask.  · If you have trouble with the mask, it is very important that you talk with your health care provider about finding a way to make the mask easier to tolerate.  This information is not intended to replace advice given to you by your health care provider. Make sure you discuss any questions you have with your health care provider.  Document Released: 02/07/2004 Document Revised: 01/11/2018 Document Reviewed: 03/30/2016  Elsevier Interactive Patient Education © 2019 [employer]

## 2018-06-28 NOTE — Telephone Encounter (Signed)
Called the patient to ask if he possibly left a umbrella at his apt today. There was no answer. LVM asking the patient to call and let us know if it was his. I will place it at the front but wanted to check with the pt first.

## 2018-06-28 NOTE — Progress Notes (Signed)
[facility]   Provider:  [name], MD   Referring Provider: [name] Infante, MD Primary Care Physician:  [name] Infante, MD  CPAP compliance.  RV 06-28-2018,  INRI SOBIESKI is a 67 y.o. male, here for CPAP yearly follow up.  Medical developments include urinary urgency currently treated with oxybutynin XL 10 mg 1 tablet daily, he has tried some CBD oil, he is on tamsulosin Flomax, Paxil has been increased from 20 to 40 mg, metformin 1000 mg daily and slow release form, Allegra as needed Zetia, Pepcid, WelChol, Temovate, Zyrtec and aspirin.  She also takes a B complex vitamin. The patient remains a highly compliant CPAP patient was 97% compliance for the last 30 days and an average 7 hours and 44 minutes use at time at night.  CPAP is set at 11 cmH2O was 1 cm EPR, he uses a minimum autotitrator.  This is an air sense 10 CPAP.   The residual apneas per hour have increased to 7.8/h and there is more air leakage.  His fatigue severity score has increased to 44 out of 63 points, but he is not excessively daytime sleepy as the Epworth sleepiness score is endorsed at 2 points only the geriatric depression score at 1 out of 15 points.   HPI:  Mr. Tugwell is  seen here as a referral from [name] for a follow up on CPAP compliance, ex law enforcement. This patient has been a patient of [facility] since 2010, after he underwent a polysomnography study on 11-13-08 which documented an AHI of 45. This was considered a Severe Degree of sleep apnea.He returned for a CPAP titration on 8/5-10 and was titrated to 13 cm water with an end expiratory pressure relief of 2 centimeter and an AHI of 5.  Hypoxemia, which was noted during the first diagnostic study was corrected with CPAP. His heart rate still very widely at the time between 47 and 84 beats per minute the average heart rate was in the 57 beats per minute range. The patient has continued to use his CPAP reliably he actually told me he uses  his CPAP religiously.  After a recent visit with [facility] he was told that he will need a face-to-face evaluation by Dr.Drake Landing and an order signed by MD. He currently endorsing the Epworth sleepiness score at 4 points and the fatigue severity score at 15 points the geriatric depression score was endorsed at 0 points.  His machine dates back to 12-2013. He will need a new one in 12-2018, and we need to invite him for re-titration- he may need BiPAP. He lost weight, his BP improved.    Son had cardiac transplant and has ataxia. Lives with parents. 2 grandchildren one with Dietrich's  Syndrome, chromosome 73, followed at [facility].  His son, [name], has a inherited cerebellar ataxia, that has been exacerbated by organ transplant rejection drugs.  Dr Toy Care sees his son, who is bipolar.  The Rampy's raise their 2 grandsons on the weeks their son has custody, and sometimes jason wouldn't even come over to visit them.   06-24-15 I see Mr. Kapaun today for his yearly scheduled CPAP compliance visit. He has continued to use his CPAP with great compliance 100% 4 days at 97% for 4 hours of daily use. Average user time is 8 hours and 30 minutes. The CPAP is set at 11 cm water was 1 cm EPR with his download showed an AHI of 8.8, which is  elevated. Its much better than the 22.8 last year!  His weight hasn't changed.   06-20-14 Mr. Don is seen here for his yearly routine revisit on 1-20 7-16 he is using CPAP compliantly has 100% compliance over the last 30 days at 100% compliance for over 4 hours of daily use. His average time of CPAP therapy is 9 hours and 5 minutes. This is today's record in my practice. The machine is set at 11 cm with 1 cm EPR his residual AHI is 7.1 which is a little higher than we like but may be related to an above average airleak. A routine replacement of the interface has helped to reduce some leakage and improve the AHI drastically. He struggled for about 3 weeks with an upper  respiratory tract infection and was forced to mouth breathes during that time. He still has a very congested nose.  06-23-2016, meeting with MM, NP.   06-28-2017, RV with CD, Mr. Masden is seen here today for a compliance visit and he is a very compliant and established CPAP user.  He has 100% compliance for the last 30 days average use of time 8 hours 44 minutes, set pressure of 11 cmH2O was 1 cm EPR and a residual AHI of 3.9.  He is still followed by [facility], his machine is not auto titration capable.  It also does not tell me if his residual apneas are obstructive or central in nature.  His 95th percentile air leak is 30.7 L but seems not to have led to an increase in apneas or hypopneas.  He uses very little humidification actually keeps at cool.  He uses a nasal mask.    Review of Systems: Out of a complete 14 system review, the patient complains of only the following symptoms, and all other reviewed systems are negative. depression score -No more nocturia, excellent sleep quality.    Current Outpatient Medications  Medication Sig Dispense Refill  . aspirin 81 MG tablet Take 81 mg by mouth daily.    Marland Kitchen b complex vitamins tablet Take 1 tablet by mouth daily.    Marland Kitchen BYDUREON 2 MG SUSR once a week.   6  . cetirizine (ZYRTEC) 10 MG tablet Take 10 mg by mouth daily.    . clobetasol cream (TEMOVATE) 0.05 % as needed.   2  . colesevelam (WELCHOL) 625 MG tablet Take 625 mg by mouth. 6 pills per day    . empagliflozin (JARDIANCE) 25 MG TABS tablet Take 25 mg by mouth daily.    Marland Kitchen ezetimibe (ZETIA) 10 MG tablet Take 10 mg by mouth daily.    . famotidine (PEPCID) 20 MG tablet Take 20 mg by mouth daily.    . fexofenadine (ALLEGRA) 180 MG tablet Take 180 mg by mouth daily. As needed    . metFORMIN (GLUMETZA) 1000 MG (MOD) 24 hr tablet Take 1,000 mg by mouth daily with breakfast.    . OVER THE COUNTER MEDICATION 15 mg daily. CBD     . oxybutynin (DITROPAN-XL) 10 MG 24 hr tablet Take 10 mg by  mouth daily.    Marland Kitchen PARoxetine (PAXIL) 40 MG tablet Take 40 mg by mouth daily.    . tamsulosin (FLOMAX) 0.4 MG CAPS capsule Take 0.4 mg by mouth 2 (two) times daily.     No current facility-administered medications for this visit.     Allergies as of 06/28/2018 - Review Complete 06/28/2018  Allergen Reaction Noted  . Midazolam Other (See Comments) 06/24/2015  .  Statins Itching 06/23/2012    Vitals: BP 126/62   Pulse 82   Ht 5\' 11"  (1.803 m)   Wt 227 lb (103 kg)   BMI 31.66 kg/m  Last Weight:  Wt Readings from Last 1 Encounters:  06/28/18 227 lb (103 kg)       Last Height:   Ht Readings from Last 1 Encounters:  06/28/18 5\' 11"  (1.803 m)    Physical exam:  General: The patient is awake, alert and appears not in acute distress. The patient is well groomed.Head: Normocephalic, atraumatic. Neck is supple. Mallampati 3 , neck circumference:17.75 . Nasal airflow restricted, congested. Runny nose- upper airway infection.  .  Cardiovascular:  Regular rate and rhythm, without  murmurs or carotid bruit, and without distended neck veins.Respiratory:  Wheezing, just finished Z pack.Skin:  Without evidence of edema, or rash.Trunk: BMI is elevated at 32.  Neurologic exam :The patient is awake and alert, oriented to place and time.   Memory subjective  described as intact. There is a normal attention span & concentration ability. Speech is fluent, dysphonia Mood and affect are appropriate.  Cranial nerves:Pupils are equal and briskly reactive to light. Extraocular movements  in vertical and horizontal planes intact and without nystagmus. Visual fields by finger perimetry are intact- Facial sensation intact to fine touch. Facial motor strength is symmetric and tongue and uvula move midline. Motor exam:  Normal tone, muscle bulk and symmetric strength in all 4 extremities. Sensory:  Fine touch, pinprick and vibration were tested in all extremities. Rapid alternating movements in the fingers/hands  is normal. Finger-to-nose maneuver normal without evidence of ataxia, dysmetria or tremor. Gait and station: Patient walks without assistive device  Deep tendon reflexes: in the upper and lower extremities are symmetric and intact. Babinski maneuver response is downgoing.  Assessment:  After physical and neurologic examination, review of laboratory studies, imaging, neurophysiology testing and pre-existing records,  15 minutes assessment is   1) Patient with known severe OSA , AHI was 45 in 2010 , now  3.9 on CPAP 11 cm water - feels well on it. Needing new supplies. Needs a re-titration in 7/ 12-2018  2) Rv in 6 month with me , not NP.   The patient was advised of the nature of the diagnosed sleep disorder, the treatment options and risks for general a health and wellness arising from not treating the condition. Visit duration was 15 minutes.  50% of the face to face time was dedicated to the discussion of treatment adherence an coordination of care.   Asencion Partridge Lerin Jech MD  06/28/2018   Cc Dr Joylene Draft. CC: Dr. Lawerance Bach is his Urologist.

## 2018-11-14 ENCOUNTER — Telehealth: Payer: Self-pay

## 2018-11-14 DIAGNOSIS — Z9989 Dependence on other enabling machines and devices: Secondary | ICD-10-CM

## 2018-11-14 DIAGNOSIS — G4733 Obstructive sleep apnea (adult) (pediatric): Secondary | ICD-10-CM

## 2018-11-14 DIAGNOSIS — I10 Essential (primary) hypertension: Secondary | ICD-10-CM

## 2018-11-14 NOTE — Telephone Encounter (Signed)
Home sleep test ordered. Pt will be due for a new machine this year in oct 2020.

## 2018-11-14 NOTE — Telephone Encounter (Signed)
Patient needs new cpap machine. Cpap was ordered. Can we change this to HST?

## 2018-12-07 ENCOUNTER — Ambulatory Visit (INDEPENDENT_AMBULATORY_CARE_PROVIDER_SITE_OTHER): Payer: Medicare Other | Admitting: Neurology

## 2018-12-07 ENCOUNTER — Other Ambulatory Visit: Payer: Self-pay

## 2018-12-07 DIAGNOSIS — G4733 Obstructive sleep apnea (adult) (pediatric): Secondary | ICD-10-CM | POA: Diagnosis not present

## 2018-12-07 DIAGNOSIS — I10 Essential (primary) hypertension: Secondary | ICD-10-CM

## 2018-12-07 DIAGNOSIS — Z9989 Dependence on other enabling machines and devices: Secondary | ICD-10-CM

## 2018-12-07 DIAGNOSIS — G4734 Idiopathic sleep related nonobstructive alveolar hypoventilation: Secondary | ICD-10-CM

## 2018-12-20 DIAGNOSIS — G4734 Idiopathic sleep related nonobstructive alveolar hypoventilation: Secondary | ICD-10-CM | POA: Insufficient documentation

## 2018-12-20 DIAGNOSIS — G4733 Obstructive sleep apnea (adult) (pediatric): Secondary | ICD-10-CM | POA: Insufficient documentation

## 2018-12-20 NOTE — Addendum Note (Signed)
Addended by: [name] on: 12/20/2018 12:51 PM   Modules accepted: Orders

## 2018-12-20 NOTE — Procedures (Signed)
Patient Information     First Name: Ruben Logan. Last Name: [name]: [id number]  Birth Date: 17-Feb-1951 Age: 68 Gender: Male  Referring Provider: [name] Infante, MD BMI: 31.8 (W=227 lb, H=5' 11'')  Neck Circ.:  18 '' Epworth:  2   Sleep Study Information    Study Date: Dec 07, 2018 S/H/A Version: [id number] / 4.1.1528 / 86  History:      [name] is seen here as a referral from [name] for a follow up on CPAP compliance, ex law enforcement. This patient has been a patient of [name] since 2010, after he underwent a polysomnography study on 11-13-08 which documented an AHI of 45/h and was titrated to 13 cm water with EPR of 2 centimeter and an AHI of 5/h.  He had Hypoxemia, which was noted during the first diagnostic study was corrected with CPAP. His heart rate still very widely at the time between 47 and 84 beats per minute the average heart rate was in the 57 beats per minute range. The patient has continued to use his CPAP religiously.    Summary & Diagnosis:     [name] still has severe sleep apnea with an AHI of 52.8/h and RDI of 58.5/h. He has brady- and tachycardia, and sleep related hypoxemia of 22.3 minutes.   Recommendations:      The patient still needs CPAP and likely finds the hypoxemia corrected on PAP therapy. A new machine will be ordered, autotitration capable with a pressure window of 8-14 and 3 cm EPR.  Physician Name: [name], MD             Sleep Summary  Oxygen Saturation Statistics   Start Study Time: End Study Time: Total Recording Time:     10:56:13 PM 7:43:48 AM      8 h, 47 min  Total Sleep Time % REM of Sleep Time:  7 h, 24 min  1.7    Mean: 93 Minimum: 81 Maximum: 99  Mean of Desaturations Nadirs (%):   89  Oxygen Desaturation. %:   4-9 10-20 >20 Total  Events Number Total   249  60 80.6 19.4  0 0.0  309 100.0  Oxygen Saturation: <90 <=88 <85 <80 <70  Duration (minutes): Sleep % 22.3 5.0 12.3 0.8 2.8 0.2 0.0 0.0  0.0 0.0     Respiratory Indices      Total Events REM NREM All Night  pRDI:  382  pAHI:  377 ODI:  309  pAHIc: 229  % CSR: 51.4 N/A N/A N/A N/A N/A N/A N/A N/A 53.5 52.8 43.2 32.1       Pulse Rate Statistics during Sleep (BPM)      Mean: 51 Minimum: 35 Maximum: 139    Indices are calculated using technically valid sleep time of 7 h, 8 min.  pRDI/pAHI are calculated using oxygen desaturations ? 3% REM/NREM indices appear only if REM time >  30 min.  Body Position Statistics  Position Supine Prone Right Left Non-Supine  Sleep (min) 141.5 30.5 155.5 116.6 302.6  Sleep % 31.9 6.9 35.0 26.2 68.1  pRDI 70.3 32.3 38.2 59.8 45.9  pAHI 70.3 28.2 37.0 59.8 44.9  ODI 68.5 14.1 21.3 50.8 31.9     Snoring Statistics Snoring Level (dB) >40 >50 >60 >70 >80 >Threshold (45)  Sleep (min) 179.7 46.7 6.6 1.1 0.0 83.0  Sleep % 40.5 10.5 1.5 0.2 0.0 18.7    Mean: 43 dB Sleep Stages  Chart

## 2018-12-21 ENCOUNTER — Telehealth: Payer: Self-pay | Admitting: Neurology

## 2018-12-21 NOTE — Telephone Encounter (Signed)
Called patient to discuss sleep study results. No answer at this time. LVM for the patient to call back.   

## 2018-12-21 NOTE — Telephone Encounter (Signed)
Patient called back and I was able to review the sleep study results with him. In looking up appear the patient machine was set up 02/2014. Advised the patient I will send [employer] the order for him. It may be closer to October before insurance will allow the new machine. Informed the patient once he is set up with the new machine would need to make sure that we see him within 31-90 days after starting the new machine. This is insurance requirement when getting a new machine. Advised the patient to make sure that once he is set up with a new machine to be sure and contact us to get scheduled. Pt verbalized understanding.

## 2018-12-21 NOTE — Telephone Encounter (Signed)
-----   Message from [name], MD sent at 12/20/2018 12:51 PM EDT ----- Summary & Diagnosis:    Ruben Logan still has severe sleep apnea with an AHI of 52.8/h and  RDI of 58.5/h. He has brady- and tachycardia, and sleep related  hypoxemia of 22.3 minutes.   Recommendations:     The patient still needs CPAP and likely finds the hypoxemia  corrected on PAP therapy.  A new machine will be ordered,  autotitration capable with a pressure window of 8-14 and 3 cm  EPR.  Physician Name: [name], MD

## 2019-01-03 ENCOUNTER — Ambulatory Visit: Payer: Medicare Other | Admitting: Neurology

## 2019-05-29 ENCOUNTER — Ambulatory Visit: Payer: Medicare PPO | Admitting: Neurology

## 2019-05-29 ENCOUNTER — Other Ambulatory Visit: Payer: Self-pay

## 2019-05-29 ENCOUNTER — Encounter: Payer: Self-pay | Admitting: Neurology

## 2019-05-29 VITALS — BP 132/83 | HR 81 | Temp 97.2°F | Ht 71.0 in | Wt 224.0 lb

## 2019-05-29 DIAGNOSIS — I10 Essential (primary) hypertension: Secondary | ICD-10-CM | POA: Diagnosis not present

## 2019-05-29 DIAGNOSIS — Z9989 Dependence on other enabling machines and devices: Secondary | ICD-10-CM | POA: Diagnosis not present

## 2019-05-29 DIAGNOSIS — G4733 Obstructive sleep apnea (adult) (pediatric): Secondary | ICD-10-CM | POA: Diagnosis not present

## 2019-05-29 NOTE — Patient Instructions (Signed)

## 2019-05-29 NOTE — Progress Notes (Signed)
SLEEP MEDICINE CLINIC   Provider:  [name], MD   Referring Provider: [name] Infante, MD Primary Care Physician:  [name] Infante, MD  CPAP compliance visit:    Ruben Logan is a 70 y.o. 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 He is a longtime established CPAP patient.  He has had a dramatic history with his son's illness during 2020, who is now at [facility] and has revoked his DNR order.  We repeated a home sleep test on July 15 of last year to see if the patient still requires CPAP and he still had severe sleep apnea with an AHI of 52.8 RDI of 58.5/h bradycardia and tachycardia and sleep-related hypoxemia for 22.3 minutes.  I ordered an auto titration capable new CPAP machine with a set pressure window between 8 cmH2O and 14 cmH2O and 3 cm EPR.  The study was performed on 7-15 2020 shortly after his birthday.  The patient has remained a very compliant CPAP user as I had expected.  His compliance is 100% for the time up to late October 2020 and for the last 30 days is 90 %.  His AHI became elevated to 7/h from only 4.4/h on 11 cm water set pressure. autoCPAP has been not easy for him.  He reduced the humidifier to low grade.  My suggestion for [name] is to reduce the AutoSet from 8 through 14-8 through 12 cmH2O the EPR will stay at 1 cmH2O and my goal is to avoid central apneas from too high of a pressure of 4.  Epworth Sleepiness Scale was endorsed at 9 points fatigue severity at 28 points-  The patient is using oxybutynin to control nocturia and urinary flow. He reports a dry mouth.     RV 06-28-2018,  Medical developments include urinary urgency currently treated with oxybutynin XL 10 mg 1 tablet daily, he has tried some CBD oil, he is on tamsulosin Flomax, Paxil has been increased from 20 to 40 mg, metformin 1000 mg daily and slow release form, Allegra as needed Zetia, Pepcid, WelChol, Temovate, Zyrtec and aspirin.  She also takes a B complex vitamin. The  patient remains a highly compliant CPAP patient was 97% compliance for the last 30 days and an average 7 hours and 44 minutes use at time at night.  CPAP is set at 11 cmH2O was 1 cm EPR, he uses a minimum autotitrator.  This is an air sense 10 CPAP.   The residual apneas per hour have increased to 7.8/h and there is more air leakage.  His fatigue severity score has increased to 44 out of 63 points, but he is not excessively daytime sleepy as the Epworth sleepiness score is endorsed at 2 points only the geriatric depression score at 1 out of 15 points.   HPI:  Ruben Logan is  seen here as a referral from [name] for a follow up on CPAP compliance, ex law enforcement. This patient has been a patient of [facility] since 2010, after he underwent a polysomnography study on 11-13-08 which documented an AHI of 45. This was considered a Severe Degree of sleep apnea.He returned for a CPAP titration on 8/5-10 and was titrated to 13 cm water with an end expiratory pressure relief of 2 centimeter and an AHI of 5.  Hypoxemia, which was noted during the first diagnostic study was corrected with CPAP. His heart rate still very widely at the time between 47 and 84 beats  per minute the average heart rate was in the 57 beats per minute range. The patient has continued to use his CPAP reliably he actually told me he uses his CPAP religiously.  After a recent visit with [facility] he was told that he will need a face-to-face evaluation by Dr.Jessiah Wojnar and an order signed by MD. He currently endorsing the Epworth sleepiness score at 4 points and the fatigue severity score at 15 points the geriatric depression score was endorsed at 0 points.  His machine dates back to 12-2013. He will need a new one in 12-2018, and we need to invite him for re-titration- he may need BiPAP. He lost weight, his BP improved.    Son had cardiac transplant and has ataxia. Lives with parents. 2 grandchildren one with Dietrich's  Syndrome,  chromosome 68, followed at [facility].  His son, Ruben Logan, has a inherited cerebellar ataxia, that has been exacerbated by organ transplant rejection drugs.  Dr Toy Care sees his son, who is bipolar.  The Axtell's raise their 2 grandsons on the weeks their son has custody, and sometimes jason wouldn't even come over to visit them.   06-24-15 I see Mr. [name] today for his yearly scheduled CPAP compliance visit. He has continued to use his CPAP with great compliance 100% 4 days at 97% for 4 hours of daily use. Average user time is 8 hours and 30 minutes. The CPAP is set at 11 cm water was 1 cm EPR with his download showed an AHI of 8.8, which is elevated. Its much better than the 22.8 last year!  His weight hasn't changed.   06-20-14 [name] is seen here for his yearly routine revisit on 1-20 7-16 he is using CPAP compliantly has 100% compliance over the last 30 days at 100% compliance for over 4 hours of daily use. His average time of CPAP therapy is 9 hours and 5 minutes. This is today's record in my practice. The machine is set at 11 cm with 1 cm EPR his residual AHI is 7.1 which is a little higher than we like but may be related to an above average airleak. A routine replacement of the interface has helped to reduce some leakage and improve the AHI drastically. He struggled for about 3 weeks with an upper respiratory tract infection and was forced to mouth breathes during that time. He still has a very congested nose.  06-23-2016, meeting with MM, NP.   06-28-2017, RV with CD, [name] is seen here today for a compliance visit and he is a very compliant and established CPAP user.  He has 100% compliance for the last 30 days average use of time 8 hours 44 minutes, set pressure of 11 cmH2O was 1 cm EPR and a residual AHI of 3.9.  He is still followed by [facility], his machine is not auto titration capable.  It also does not tell me if his residual apneas are obstructive or central in nature.   His 95th percentile air leak is 30.7 L but seems not to have led to an increase in apneas or hypopneas.  He uses very little humidification actually keeps at cool.  He uses a nasal mask.    Review of Systems: Out of a complete 14 system review, the patient complains of only the following symptoms, and all other reviewed systems are negative. depression score -No more nocturia, excellent sleep quality.    Current Outpatient Medications  Medication Sig Dispense Refill  .  aspirin 81 MG tablet Take 81 mg by mouth daily.    . cetirizine (ZYRTEC) 10 MG tablet Take 10 mg by mouth daily.    . Dulaglutide (TRULICITY [place]) Inject 1 Dose into the skin daily.    . empagliflozin (JARDIANCE) 25 MG TABS tablet Take 25 mg by mouth daily.    Marland Kitchen ezetimibe (ZETIA) 10 MG tablet Take 10 mg by mouth daily.    . famotidine (PEPCID) 20 MG tablet Take 20 mg by mouth daily.    . fexofenadine (ALLEGRA) 180 MG tablet Take 180 mg by mouth daily. As needed    . metFORMIN (GLUMETZA) 1000 MG (MOD) 24 hr tablet Take 1,000 mg by mouth 2 (two) times daily with a meal.     . OVER THE COUNTER MEDICATION 15 mg daily. CBD     . oxybutynin (DITROPAN) 5 MG tablet Take 5 mg by mouth.     Marland Kitchen PARoxetine (PAXIL) 40 MG tablet Take 40 mg by mouth daily.     No current facility-administered medications for this visit.    Allergies as of 05/29/2019 - Review Complete 05/29/2019  Allergen Reaction Noted  . Midazolam Other (See Comments) 06/24/2015  . Statins Itching 06/23/2012    Vitals: BP 120/80 (BP Location: Right Arm, Patient Position: Sitting, Cuff Size: Normal)   Temp (!) 97.2 F (36.2 C)   Ht 5\' 11"  (1.803 m)   Wt 224 lb (101.6 kg)   BMI 31.24 kg/m  Last Weight:  Wt Readings from Last 1 Encounters:  05/29/19 224 lb (101.6 kg)       Last Height:   Ht Readings from Last 1 Encounters:  05/29/19 5\' 11"  (1.803 m)    Physical exam:  General: The patient is awake, alert and appears not in acute distress. The patient is  well groomed.Head: Normocephalic, atraumatic. Neck is supple. Mallampati 3 , neck circumference:17.75 . Nasal airflow restricted, congested. Runny nose- upper airway infection.  .  Cardiovascular:  Regular rate and rhythm, without  murmurs or carotid bruit, and without distended neck veins.Respiratory:  Wheezing, just finished Z pack.Skin:  Without evidence of edema, or rash.Trunk: BMI is elevated at 32.  Neurologic exam :The patient is awake and alert, oriented to place and time.   Memory subjective  described as intact. There is a normal attention span & concentration ability. Speech is fluent, dysphonia Mood and affect are appropriate.  Cranial nerves:Pupils are equal and briskly reactive to light. Extraocular movements  in vertical and horizontal planes intact and without nystagmus. Visual fields by finger perimetry are intact- Facial sensation intact to fine touch. Facial motor strength is symmetric and tongue and uvula move midline. Motor exam:  Normal tone, muscle bulk and symmetric strength in all 4 extremities. Finger-to-nose maneuver  without evidence of ataxia, dysmetria . There is a mild tremor, essential.   Gait and station: Patient walks without assistive device  Deep tendon reflexes: in the upper and lower extremities are symmetric and intact. Babinski maneuver response is downgoing.  Assessment:  After physical and neurologic examination, review of laboratory studies, imaging, neurophysiology testing and pre-existing records,  20 minutes assessment is   1) Patient with known severe OSA , AHI was 45 in 2010 , now  3.9 on CPAP 11 cm water - feels well on it. Needing new supplies. Needed a re-titration in 7/ 12-2018. We repeated a home sleep test to document the need for sleep apnea treatment and date of service was December 07, 2018 and ended up  with a severe sleep apnea diagnosis again AHI 52.8 RDI 58.5 and total hypoxemia time of 22.3 minutes, tachycardia and bradycardia were noted.  I  ordered an autotitrator was a pressure window between 8 and 14 cmH2O 3 cm EPR.  In the meantime we already reduced the humidity to stage I, the EPR to 1 cmH2O and today I will reduce the upper pressure window to 12 cm water in order to eliminate central apneas which have been seen on his downloads.  2) Rv in 6 month with me , not NP.   The patient was advised of the nature of the diagnosed sleep disorder, the treatment options and risks for general a health and wellness arising from not treating the condition. Visit duration was 66minutes.  50% of the face to face time was dedicated to the discussion of treatment adherence an coordination of care.   Asencion Partridge Ridley Schewe MD  05/29/2019   Cc Dr Joylene Draft. CC: Dr. Lawerance Bach is his Urologist.

## 2019-11-29 ENCOUNTER — Other Ambulatory Visit: Payer: Self-pay

## 2019-11-29 ENCOUNTER — Encounter: Payer: Self-pay | Admitting: Adult Health

## 2019-11-29 ENCOUNTER — Ambulatory Visit: Payer: Medicare PPO | Admitting: Adult Health

## 2019-11-29 VITALS — BP 118/68 | HR 102 | Ht 71.0 in | Wt 222.8 lb

## 2019-11-29 DIAGNOSIS — G4733 Obstructive sleep apnea (adult) (pediatric): Secondary | ICD-10-CM

## 2019-11-29 DIAGNOSIS — Z9989 Dependence on other enabling machines and devices: Secondary | ICD-10-CM

## 2019-11-29 NOTE — Patient Instructions (Signed)
Continue using CPAP nightly and greater than 4 hours each night °If your symptoms worsen or you develop new symptoms please let us know.  ° °

## 2019-11-29 NOTE — Progress Notes (Signed)
PATIENT: Ruben Logan. DOB: 1950/08/10  REASON FOR VISIT: follow up HISTORY FROM: patient  HISTORY OF PRESENT ILLNESS: Today 11/29/19:  Ruben Logan is a 69 year old male with a history of obstructive sleep apnea on CPAP.  His download indicates that he uses machine nightly for compliance of 100%.  He uses machine greater than 4 hours 29 days for compliance of 97%.  On average he uses his machine 7 hours and 38 minutes.  His residual AHI is 6.7 on 8 to 12 cm of water with EPR 1.  Leak in the 95th percentile is 49.8 L/min.  Reports that he tends to lay on the right side and this is when his mask leaks.  HISTORY  (Copied from Dr.Dohmeier's note) Ruben Logan is a 69 y.o. 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 He is a longtime established CPAP patient.  He has had a dramatic history with his son's illness during 2020, who is now at [facility] and has revoked his DNR order.  We repeated a home sleep test on July 15 of last year to see if the patient still requires CPAP and he still had severe sleep apnea with an AHI of 52.8 RDI of 58.5/h bradycardia and tachycardia and sleep-related hypoxemia for 22.3 minutes.  I ordered an auto titration capable new CPAP machine with a set pressure window between 8 cmH2O and 14 cmH2O and 3 cm EPR.  The study was performed on 7-15 2020 shortly after his birthday.  The patient has remained a very compliant CPAP user as I had expected.  His compliance is 100% for the time up to late October 2020 and for the last 30 days is 90 %.  His AHI became elevated to 7/h from only 4.4/h on 11 cm water set pressure. autoCPAP has been not easy for him.  He reduced the humidifier to low grade.  My suggestion for Mr. Palecek is to reduce the AutoSet from 8 through 14-8 through 12 cmH2O the EPR will stay at 1 cmH2O and my goal is to avoid central apneas from too high of a pressure of 4.  Epworth Sleepiness Scale was endorsed at 9 points fatigue  severity at 28 points-  The patient is using oxybutynin to control nocturia and urinary flow. He reports a dry mouth.    REVIEW OF SYSTEMS: Out of a complete 14 system review of symptoms, the patient complains only of the following symptoms, and all other reviewed systems are negative.  FSS 18 ESS 7  ALLERGIES: Allergies  Allergen Reactions  . Midazolam Other (See Comments)  . Statins Itching    HOME MEDICATIONS: Outpatient Medications Prior to Visit  Medication Sig Dispense Refill  . aspirin 81 MG tablet Take 81 mg by mouth daily.    . cetirizine (ZYRTEC) 10 MG tablet Take 10 mg by mouth daily.    . Dulaglutide (TRULICITY [place]) Inject 1 Dose into the skin daily.    . empagliflozin (JARDIANCE) 25 MG TABS tablet Take 25 mg by mouth daily.    Marland Kitchen ezetimibe (ZETIA) 10 MG tablet Take 10 mg by mouth daily.    . famotidine (PEPCID) 20 MG tablet Take 20 mg by mouth daily.    . fexofenadine (ALLEGRA) 180 MG tablet Take 180 mg by mouth daily. As needed    . metFORMIN (GLUMETZA) 1000 MG (MOD) 24 hr tablet Take 1,000 mg by mouth 2 (two) times daily with a meal.     .  OVER THE COUNTER MEDICATION 50 mg daily. CBD     . oxybutynin (DITROPAN) 5 MG tablet Take 5 mg by mouth.     Marland Kitchen PARoxetine (PAXIL) 40 MG tablet Take 40 mg by mouth daily.     No facility-administered medications prior to visit.    PAST MEDICAL HISTORY: Past Medical History:  Diagnosis Date  . Cancer [facility])    Bladder cancer 2005  . Depression with anxiety   . Diabetes ([place])   . Hyperlipidemia   . Kidney stones   . OSA on CPAP   . OSA on CPAP 12/29/2013  . Sleep apnea    wears CPAP    PAST SURGICAL HISTORY: Past Surgical History:  Procedure Laterality Date  . bladder cancer    . COLONOSCOPY    . kidney stones      FAMILY HISTORY: Family History  Problem Relation Age of Onset  . Heart disease Father   . Diabetes Mother   . Heart disease Son   . Diabetes Sister   . Colon cancer Neg Hx   . Esophageal cancer Neg  Hx   . Stomach cancer Neg Hx   . Rectal cancer Neg Hx     SOCIAL HISTORY: Social History   Socioeconomic History  . Marital status: Married    Spouse name: Horris Latino  . Number of children: 1  . Years of education: 42  . Highest education level: Not on file  Occupational History  . Not on file  Tobacco Use  . Smoking status: Former Smoker    Quit date: 03/16/2010    Years since quitting: 9.7  . Smokeless tobacco: Never Used  Substance and Sexual Activity  . Alcohol use: No  . Drug use: No  . Sexual activity: Not on file  Other Topics Concern  . Not on file  Social History Narrative   Patient is married Horris Latino) and lives at home with his wife.   Patient has one adult child.   Patient is retired Pharmacist, community)   Patient has a Financial risk analyst.   Patient is right-handed.   Patient drinks four cups of caffeine daily (coffee, soda and tea).   Social Determinants of Health   Financial Resource Strain:   . Difficulty of Paying Living Expenses:   Food Insecurity:   . Worried About Charity fundraiser in the Last Year:   . Arboriculturist in the Last Year:   Transportation Needs:   . Film/video editor (Medical):   Marland Kitchen Lack of Transportation (Non-Medical):   Physical Activity:   . Days of Exercise per Week:   . Minutes of Exercise per Session:   Stress:   . Feeling of Stress :   Social Connections:   . Frequency of Communication with Friends and Family:   . Frequency of Social Gatherings with Friends and Family:   . Attends Religious Services:   . Active Member of Clubs or Organizations:   . Attends Archivist Meetings:   Marland Kitchen Marital Status:   Intimate Partner Violence:   . Fear of Current or Ex-Partner:   . Emotionally Abused:   Marland Kitchen Physically Abused:   . Sexually Abused:       PHYSICAL EXAM  Vitals:   11/29/19 1043  BP: 118/68  Pulse: (!) 102  Weight: 222 lb 12.8 oz (101.1 kg)  Height: 5\' 11"  (1.803 m)   Body mass index is 31.07  kg/m.  Generalized: Well developed, in no acute distress  Chest: Lungs clear to auscultation bilaterally  Neurological examination  Mentation: Alert oriented to time, place, history taking. Follows all commands speech and language fluent Cranial nerve II-XII: Extraocular movements were full, visual field were full on confrontational test Head turning and shoulder shrug  were normal and symmetric. Motor: The motor testing reveals 5 over 5 strength of all 4 extremities. Good symmetric motor tone is noted throughout.  Sensory: Sensory testing is intact to soft touch on all 4 extremities. No evidence of extinction is noted.  Gait and station: Gait is normal.    DIAGNOSTIC DATA (LABS, IMAGING, TESTING) - I reviewed patient records, labs, notes, testing and imaging myself where available.     ASSESSMENT AND PLAN 69 y.o. year old male  has a past medical history of Cancer ([place]), Depression with anxiety, Diabetes ([place]), Hyperlipidemia, Kidney stones, OSA on CPAP, OSA on CPAP (12/29/2013), and Sleep apnea. here with:  1. OSA on CPAP  - CPAP compliance excellent - Good treatment of AHI  -Encouraged the patient to try to put straps tighter on the right side in order to fix leak. - Encourage patient to use CPAP nightly and > 4 hours each night - F/U in 1 year or sooner if needed   I spent 20 minutes of face-to-face and non-face-to-face time with patient.  This included previsit chart review, lab review, study review, order entry, electronic health record documentation, patient education.  Ward Givens, MSN, NP-C 11/29/2019, 10:53 AM [facility] [address], [place], [place] [address] [phone]

## 2020-04-24 ENCOUNTER — Encounter: Payer: Self-pay | Admitting: Cardiology

## 2020-04-24 ENCOUNTER — Ambulatory Visit: Payer: Medicare PPO | Admitting: Cardiology

## 2020-04-24 ENCOUNTER — Other Ambulatory Visit: Payer: Self-pay

## 2020-04-24 VITALS — BP 130/64 | HR 72 | Ht 71.0 in | Wt 215.8 lb

## 2020-04-24 DIAGNOSIS — Z6835 Body mass index (BMI) 35.0-35.9, adult: Secondary | ICD-10-CM

## 2020-04-24 DIAGNOSIS — R0789 Other chest pain: Secondary | ICD-10-CM

## 2020-04-24 DIAGNOSIS — E785 Hyperlipidemia, unspecified: Secondary | ICD-10-CM

## 2020-04-24 DIAGNOSIS — Z8249 Family history of ischemic heart disease and other diseases of the circulatory system: Secondary | ICD-10-CM | POA: Diagnosis not present

## 2020-04-24 DIAGNOSIS — I10 Essential (primary) hypertension: Secondary | ICD-10-CM

## 2020-04-24 DIAGNOSIS — R079 Chest pain, unspecified: Secondary | ICD-10-CM | POA: Diagnosis not present

## 2020-04-24 DIAGNOSIS — G4733 Obstructive sleep apnea (adult) (pediatric): Secondary | ICD-10-CM | POA: Diagnosis not present

## 2020-04-24 HISTORY — DX: Other chest pain: R07.89

## 2020-04-24 MED ORDER — METOPROLOL TARTRATE 50 MG PO TABS: 100.0000 mg | ORAL_TABLET | Freq: Once | ORAL | 0 refills | Status: DC

## 2020-04-24 NOTE — Progress Notes (Signed)
Primary Care Provider: [name] Infante, MD Cardiologist: Glenetta Hew, MD Electrophysiologist: None  Clinic Note:  Ruben Ill. - "Ruben Logan" is a 69 y.o. male with a PMH notable for HYPERTENSION, HYPERLIPIDEMIA, FORMER SMOKER, w/ STRONG FAMILY HISTORY OF CAD below who is being seen today for the evaluation of CHEST PAIN at the request of [name] Infante, MD.  Chief Complaint  Patient presents with  . New Patient (Initial Visit)    Chest pain    ASSESSMENT/PLAN   Problem List Items Addressed This Visit    Class 2 severe obesity due to excess calories with serious comorbidity and body mass index (BMI) of 35.0 to 35.9 in adult [facility]) (Chronic)   Relevant Orders   EKG 12-Lead (Completed)   CT CORONARY MORPH W/CTA COR W/SCORE W/CA W/CM &/OR WO/CM   CT CORONARY FRACTIONAL FLOW RESERVE DATA PREP   CT CORONARY FRACTIONAL FLOW RESERVE FLUID ANALYSIS   Basic metabolic panel   Essential hypertension (Chronic)    Blood pressure looks pretty good on no medicines at present.      Relevant Medications   sildenafil (REVATIO) 20 MG tablet   Severe obstructive sleep apnea-hypopnea syndrome (Chronic)    On CPAP.  This could be contributing to some dyspnea but less likely.      Relevant Orders   EKG 12-Lead (Completed)   CT CORONARY MORPH W/CTA COR W/SCORE W/CA W/CM &/OR WO/CM   CT CORONARY FRACTIONAL FLOW RESERVE DATA PREP   CT CORONARY FRACTIONAL FLOW RESERVE FLUID ANALYSIS   Basic metabolic panel   Dyslipidemia, goal LDL below 100 (Chronic)    At present, goal LDL is less than 100 just based on his risk factors of age and family history.  Now with him having exertional dyspnea, we have been evaluating for ischemic CAD and may change the target level.  Presently, lipids are at goal with an LDL of 85 on Zetia alone.        Relevant Medications   sildenafil (REVATIO) 20 MG tablet   Other Relevant Orders   CT CORONARY MORPH W/CTA COR W/SCORE W/CA W/CM &/OR WO/CM   CT CORONARY  FRACTIONAL FLOW RESERVE DATA PREP   CT CORONARY FRACTIONAL FLOW RESERVE FLUID ANALYSIS   Chest pain on exertion - Primary    Chest pain and dyspnea with exertion.  Is really chest discomfort happens when breathing hard.  Not sure if this musculoskeletal anginal related.  However based on the progressive exertional dyspnea, there is a least moderate potential ischemic CAD.  Plan: Check Coronary CTA-FFR      Relevant Orders   EKG 12-Lead (Completed)   CT CORONARY MORPH W/CTA COR W/SCORE W/CA W/CM &/OR WO/CM   CT CORONARY FRACTIONAL FLOW RESERVE DATA PREP   CT CORONARY FRACTIONAL FLOW RESERVE FLUID ANALYSIS   Basic metabolic panel   Family history of premature CAD    Pretty significant history of premature CAD.  I do not know that his son had an embolic event from [place], but I would imagine that his father and uncle both had regular CAD.  He is now complaining about chest discomfort that is mostly at rest, but also may be with associated exertional dyspnea.  Would like to get a baseline cardiovascular risk assessment of his coronary anatomy and potential ischemia.  Plan: Check Coronary CTA with CT FFR  Adjust glycemic and lipid control based on coronary CTA findings.      Relevant Orders   EKG 12-Lead (Completed)  CT CORONARY MORPH W/CTA COR W/SCORE W/CA W/CM &/OR WO/CM   CT CORONARY FRACTIONAL FLOW RESERVE DATA PREP   CT CORONARY FRACTIONAL FLOW RESERVE FLUID ANALYSIS   Basic metabolic panel    Other Visit Diagnoses    Other chest pain       Relevant Orders   CT CORONARY FRACTIONAL FLOW RESERVE DATA PREP   CT CORONARY FRACTIONAL FLOW RESERVE FLUID ANALYSIS     -------------------------------------------------------------------  HPI:    Ruben Ill. was last seen back in January 2014 for evaluation of abnormal EKG suggesting prior MI. ->  Reevaluated with treadmill Myoview stress test reviewed below.  Following this test, he was lost to follow-up.  He was  just seen by Dr. Lawanda Cousins on October 28 for routine follow-up and noted to chest pain/tightness time not necessarily exertional.  Lasting up to 30 minutes with no radiation.  No exertional dyspnea -> referred to cardiology.  Recent Hospitalizations: None  Reviewed  CV studies:    The following studies were reviewed today: (if available, images/films reviewed: From Epic Chart or Care Everywhere)  Myoview January 2014: Fair exercise capacity.  Hypertensive diastolic blood pressure.  No EKG changes consistent with ischemia.  Normal LV function.  Normal wall motion.  No ischemia or infarction.--LOW RISK   Interval History:   [name]. presents here today for cardiology evaluation.  Tell me a long story about all the stress going on with his son Ruben Logan, of the divorce, his transplant with possible concerns for transplant rejection.  His son had a major MI at age 28 and up with ischemic cardiomyopathy and okay transplant.  (What I did not know was that he had factor V Leiden deficiency).  In addition to his son, his father and paternal uncle also had pretty significant CAD both died from heart attacks in his late 15s early 77s.  He said that in addition to his son causing all the stress, he is helping to be caregiver for his mother and father-in-law-father lives in his 41s with dementia, and think about the diet.  Brother-in-law is also had medical issues having a fall from ladder and stroke.  With all this, he has been having intermittent episodes of what he describes as a fluttering sensation in his chest that lasts a few seconds here and there.  He says has had off-and-on fluttering sensations as well as chest discomfort.  Usually this happens at rest, is not necessarily associate with dyspnea.  The spells are relatively short-lived lasting less than a minute or 2.  He describes a fluttering as lasting only few seconds.  He also says he is working on trying increase his exercise stamina and  does fine as long as he is working on flexion, but if he goes up a hill his breathing definitely get harder to the point where he has to stop sometimes.  The dyspnea gets bad enough that he sometimes may feel some chest discomfort.  This goes away when he rests.  CV Review of Symptoms (Summary) Cardiovascular ROS: positive for - chest pain, dyspnea on exertion, irregular heartbeat and palpitations negative for - edema, orthopnea, paroxysmal nocturnal dyspnea, rapid heart rate, shortness of breath or Lightheadedness or dizziness, syncope/near syncope or TIA/amaurosis fugax, claudication  The patient does not have symptoms concerning for COVID-19 infection (fever, chills, cough, or new shortness of breath).   REVIEWED OF SYSTEMS   Review of Systems  Constitutional: Negative for malaise/fatigue.  HENT: Positive for hearing loss.  Negative for congestion.   Respiratory: Negative for cough and shortness of breath.   Cardiovascular: Positive for chest pain and palpitations. Negative for leg swelling.       Per HPI  Gastrointestinal: Positive for abdominal pain, constipation, diarrhea and heartburn.       Back-and-forth between constipation and diarrhea  Genitourinary: Positive for frequency (Nocturia) and urgency.       Followed by urology.  No loss of bladder control.  Musculoskeletal: Positive for joint pain. Negative for falls.       He has a spontaneous left foot drop  Neurological: Negative for dizziness, focal weakness and weakness.  Endo/Heme/Allergies: Positive for environmental allergies. Does not bruise/bleed easily.  Psychiatric/Behavioral: Negative for depression (Just stressed) and memory loss. The patient has insomnia (Sometimes has a hard time sleeping because of stress).        Significant social stress--their son Ruben Logan is very negative, not taking medications correctly, is drinking alcohol and potentially abusing other substances. He and Horris Latino are hoping to care for the  grandchildren ; Also had an aunt died from [place] recently and another aunt died with old age.   I have reviewed and (if needed) personally updated the patient's problem list, medications, allergies, past medical and surgical history, social and family history.   PAST MEDICAL HISTORY   Past Medical History:  Diagnosis Date  . Depression with anxiety    On Paxil  . Diabetes mellitus type II, non insulin dependent ([place])    On combination of Jardiance, Metformin, Trulicity  . Diverticulosis   . Hearing loss   . History of bladder cancer 2005  . Hx of adenomatous colonic polyps   . Hyperlipidemia    On Zetia  . Kidney stones   . OSA on CPAP 12/29/2013   Severe OSA-Dr. Dohmeier    PAST SURGICAL HISTORY   Past Surgical History:  Procedure Laterality Date  . bladder cancer  2005  . COLONOSCOPY    . Depression     On and off  . kidney stones     Presumably lithotripsy  . NM MYOVIEW LTD  05/2012   Order for abnormal EKG: [facility]):  Fair exercise capacity.  Hypertensive diastolic blood pressure.  No EKG changes consistent with ischemia.  Normal LV function.  Normal wall motion.  No ischemia or infarction.--LOW RISK    Immunization History  Administered Date(s) Administered  . Moderna SARS-COVID-2 Vaccination 03/22/2020    MEDICATIONS/ALLERGIES   Current Meds  Medication Sig  . aspirin 81 MG tablet Take 81 mg by mouth daily.  . cetirizine (ZYRTEC) 10 MG tablet Take 10 mg by mouth daily.  . Dulaglutide (TRULICITY [place]) Inject 1 Dose into the skin daily.  . empagliflozin (JARDIANCE) 25 MG TABS tablet Take 25 mg by mouth daily.  Marland Kitchen ezetimibe (ZETIA) 10 MG tablet Take 10 mg by mouth daily.  . famotidine (PEPCID) 20 MG tablet Take 20 mg by mouth daily.  . fexofenadine (ALLEGRA) 180 MG tablet Take 180 mg by mouth daily. As needed  . metFORMIN (GLUMETZA) 1000 MG (MOD) 24 hr tablet Take 1,000 mg by mouth 2 (two) times daily with a meal.   . mirabegron ER (MYRBETRIQ) 50 MG TB24 tablet  Take 1 tablet by mouth daily.  Marland Kitchen OVER THE COUNTER MEDICATION 50 mg daily. CBD   . oxybutynin (DITROPAN) 5 MG tablet Take 5 mg by mouth.   Marland Kitchen PARoxetine (PAXIL) 40 MG tablet Take 40 mg by mouth daily.  . sildenafil (REVATIO) 20  MG tablet Take by mouth.  . tamsulosin (FLOMAX) 0.4 MG CAPS capsule Take by mouth.    Allergies  Allergen Reactions  . Midazolam Other (See Comments)  . Ozempic (0.25 Or 0.5 Mg-Dose) [Semaglutide(0.25 Or 0.32m-Dos)]     Heartburn abdominal pain as well as diarrhea and irritability  . Prozac [Fluoxetine Hcl]     Made him suicidal  . Statins Itching    Rosuvastatin, pravastatin, simvastatin: Aches and pains with GI upset    SOCIAL HISTORY/FAMILY HISTORY   Reviewed in Epic:  Pertinent findings:  Social History   Tobacco Use  . Smoking status: Former Smoker    Packs/day: 1.00    Years: 46.00    Pack years: 46.00    Types: Cigarettes    Quit date: 03/16/2010    Years since quitting: 10.1  . Smokeless tobacco: Never Used  Substance Use Topics  . Alcohol use: No  . 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 ->  They have 2 grandchildren (MCornelia Copa 12-> significant handicap-autism spectrum and AHerschel [place] 139   Patient has one adult child  - JCorene Logan who lives with them since 2018-> He is s/p Heart Transplant (as a result of Large Anterior STEMI - LM CAD); separated from his family (divorce between 2015-2017), -> during the divorce settlement, the grandchildren stay with them every other weekend.      --> JCorene Corneahas problems with EtOH and polysubstance abuse, Med Non-compliant which is a major issue in a transplant patient, starting to talk about wanting to "give up   --> there has been lots of stress for TMagnoliatrying to keep JCorene Corneaon track with his medical compliance.  There is concern about possible transplant rejection, thankfully that was not the case.  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   Patient drinks four cups of caffeine daily (coffee, soda and tea).      He enjoys reading but not to be other hobbies besides tinkering with mild mDealerwork-currently working on a lMedia plannerthat quit working..    Significant social stress with JCorene Corneabeing very difficult.  There was concerns about possible rejection of the transplant.  He is drinking alcohol and possibly due to substances.  Not taking his medications correctly.  Berry negative.  Serving as part of caregivers for their 2 grandchildren-very stressful  Aunt recently died from CCOVID-55infection.  Second aunt died of age.  Family History  Problem Relation Age of Onset  . Heart attack Father 624      Initial MI early 657s died as a result of his third MI)  . Coronary artery disease Father 674 . Diabetes Mellitus II Mother   . Alzheimer's disease Mother   . Coronary artery disease Son 352 . Heart attack Son 364      Left main CAD-ischemic cardiomyopathy  . Cardiomyopathy Son 371      Ischemic-ICD, now status post transplant with ICD.  .Marland KitchenAlcohol abuse Son   . Factor V Leiden deficiency Son   . Diabetes Sister   . Coronary artery disease Paternal Uncle 639 . Heart attack Paternal Uncle  62       Died as a result of CAD-MI in his late 42s  . Colon cancer Neg Hx   . Esophageal cancer Neg Hx   . Stomach cancer Neg Hx   . Rectal cancer Neg Hx     OBJCTIVE -PE, EKG, labs   Wt Readings from Last 3 Encounters:  04/24/20 215 lb 12.8 oz (97.9 kg)  11/29/19 222 lb 12.8 oz (101.1 kg)  05/29/19 224 lb (101.6 kg)    Physical Exam: BP 130/64   Pulse 72   Ht '5\' 11"'  (1.803 m)   Wt 215 lb 12.8 oz (97.9 kg)   BMI 30.10 kg/m  Physical Exam Vitals reviewed.  Constitutional:      General: He is not in acute  distress.    Appearance: Normal appearance. He is obese. He is not ill-appearing or toxic-appearing.     Comments: Well-nourished, well-groomed.  HENT:     Head: Normocephalic and atraumatic.  Neck:     Vascular: No carotid bruit, hepatojugular reflux or JVD.  Cardiovascular:     Rate and Rhythm: Normal rate and regular rhythm.  No extrasystoles are present.    Pulses: Normal pulses and intact distal pulses.     Heart sounds: Normal heart sounds. No murmur heard.  No friction rub. No gallop.   Pulmonary:     Effort: Pulmonary effort is normal. No respiratory distress.     Breath sounds: Normal breath sounds.  Chest:     Chest wall: No tenderness.  Abdominal:     General: Bowel sounds are normal. There is no distension.     Palpations: There is no mass (No HSM).     Comments: Truncal obesity-protuberant  Musculoskeletal:     Cervical back: Normal range of motion and neck supple.  Skin:    General: Skin is warm and dry.  Neurological:     General: No focal deficit present.     Mental Status: He is alert and oriented to person, place, and time.     Cranial Nerves: No cranial nerve deficit.     Gait: Gait normal.  Psychiatric:        Mood and Affect: Mood normal.        Behavior: Behavior normal.        Thought Content: Thought content normal.        Judgment: Judgment normal.     Comments: Pleasant      Adult ECG Report  Rate: 72 ;  Rhythm: normal sinus rhythm and Normal axis and intervals durations.;   Narrative Interpretation: Normal EKG  Recent Labs: 03/14/2020  Na+ 143, K+ 5.2, Cl- 105, HCO3-25, BUN 12, Cr 1.0, Glu 198, Ca2+ 10.4; AST 22, ALT 23, AlkP 66  CBC: W 5.42, H/H 15.8/46.6, Plt 198  TC 177, TG 118, HDL 68, LDL 85; apolipoprotein B 81 mL A1c 8.9; TSH 0.9 with free T4 of 0.9. No results found for: CHOL, HDL, LDLCALC, LDLDIRECT, TRIG, CHOLHDL No results found for: CREATININE, BUN, NA, K, CL, CO2 No results found for:  TSH  --------------------------------------------------- COVID-19 Education: The signs and symptoms of COVID-19 were discussed with the patient and how to seek care for testing (follow up with PCP or arrange E-visit).   The importance of social distancing and COVID-19 vaccination was discussed today. 1 min The patient is practicing social distancing & Masking.   I spent a total of 47mnutes with the patient spent in direct patient consultation.  Additional time spent with  chart review  / charting (studies, outside notes, etc): 45 -> Clinic note reviewed, labs updated,-history reviewed in epic and Care Everywhere Total Time: 82 min   Current medicines are reviewed at length with the patient today.  (+/- concerns) n/a  This visit occurred during the SARS-CoV-2 public health emergency.  Safety protocols were in place, including screening questions prior to the visit, additional usage of staff PPE, and extensive cleaning of exam room while observing appropriate contact time as indicated for disinfecting solutions.  Notice: This dictation was prepared with Dragon dictation along with smaller phrase technology. Any transcriptional errors that result from this process are unintentional and may not be corrected upon review.  Patient Instructions / Medication Changes & Studies & Tests Ordered   Patient Instructions  Medication Instructions:  See instruction sheet *If you need a refill on your cardiac medications before your next appointment, please call your pharmacy*   Lab Work: See instruction sheet   If you have labs (blood work) drawn today and your tests are completely normal, you will receive your results only by: Marland Kitchen MyChart Message (if you have MyChart) OR . A paper copy in the mail If you have any lab test that is abnormal or we need to change your treatment, we will call you to review the results.   Testing/Procedures: Will be schedule at [facility] at  Radiology - once authorization is obtained by your insurance Cardiac CT Angiography (CTA), is a special type of CT scan that uses a computer to produce multi-dimensional views of major blood vessels throughout the Heart. In CT angiography, a contrast material is injected through an IV to help visualize the blood vessels   Follow-Up: At [facility], you and your health needs are our priority.  As part of our continuing mission to provide you with exceptional heart care, we have created designated Provider Care Teams.  These Care Teams include your primary Cardiologist (physician) and Advanced Practice Providers (APPs -  Physician Assistants and Nurse Practitioners) who all work together to provide you with the care you need, when you need it.  We recommend signing up for the patient portal called "MyChart".  Sign up information is provided on this After Visit Summary.  MyChart is used to connect with patients for Virtual Visits (Telemedicine).  Patients are able to view lab/test results, encounter notes, upcoming appointments, etc.  Non-urgent messages can be sent to your provider as well.   To learn more about what you can do with MyChart, go to [email or web address].    Your next appointment:   2 month(s)  The format for your next appointment:   In Person  Provider:   Glenetta Hew, MD   Other Instructions   Your cardiac CT will be scheduled the below location:   [facility] [address] [place], [place] [address] [phone]    Please arrive at the [facility] main entrance of [facility] 30 minutes prior to test start time. Proceed to the [facility] Radiology Department (first floor) to check-in and test prep.    Please follow these instructions carefully (unless otherwise directed):  Hold all erectile dysfunction medications at least 3 days (72 hrs) prior to test.   Please have lab work BMP done one week prior to testing    On the Night  Before the Test: . Be sure to Drink plenty of water. . Do not consume any caffeinated/decaffeinated beverages or chocolate 12 hours prior to  your test. . Do not take any antihistamines 12 hours prior to your test. .   On the Day of the Test: . Drink plenty of water. Do not drink any water within one hour of the test. . Do not eat any food 4 hours prior to the test. . You may take your regular medications prior to the test.  . Take metoprolol (Lopressor) 100 mg two hours prior to test.        After the Test: . Drink plenty of water. . After receiving IV contrast, you may experience a mild flushed feeling. This is normal. . On occasion, you may experience a mild rash up to 24 hours after the test. This is not dangerous. If this occurs, you can take Benadryl 25 mg and increase your fluid intake. . If you experience trouble breathing, this can be serious. If it is severe call 911 IMMEDIATELY. If it is mild, please call our office. . If you take any of these medications: Glipizide/Metformin, Avandament, Glucavance, please do not take 48 hours after completing test unless otherwise instructed.   Once we have confirmed authorization from your insurance company, we will call you to set up a date and time for your test. Based on how quickly your insurance processes prior authorizations requests, please allow up to 4 weeks to be contacted for scheduling your Cardiac CT appointment. Be advised that routine Cardiac CT appointments could be scheduled as many as 8 weeks after your provider has ordered it.  For non-scheduling related questions, please contact the cardiac imaging nurse navigator should you have any questions/concerns: Marchia Bond, Cardiac Imaging Nurse Navigator Burley Saver, Interim Cardiac Imaging Nurse [place] [facility] V[facility] Services Direct Office Dial: [phone]   For scheduling needs, including cancellations and rescheduling, please call [place],  [phone].    Studies Ordered:   Orders Placed This Encounter  Procedures  . CT CORONARY MORPH W/CTA COR W/SCORE W/CA W/CM &/OR WO/CM  . CT CORONARY FRACTIONAL FLOW RESERVE DATA PREP  . CT CORONARY FRACTIONAL FLOW RESERVE FLUID ANALYSIS  . Basic metabolic panel  . EKG 12-Lead     Glenetta Hew, M.D., M.S. Interventional Cardiologist   Pager # [phone] Phone # [phone] [address] [place], [place] [address]   Thank you for choosing Heartcare at [facility]!!

## 2020-04-24 NOTE — Patient Instructions (Addendum)
Medication Instructions:  See instruction sheet *If you need a refill on your cardiac medications before your next appointment, please call your pharmacy*   Lab Work: See instruction sheet   If you have labs (blood work) drawn today and your tests are completely normal, you will receive your results only by: Marland Kitchen MyChart Message (if you have MyChart) OR . A paper copy in the mail If you have any lab test that is abnormal or we need to change your treatment, we will call you to review the results.   Testing/Procedures: Will be schedule at [facility] at Radiology - once authorization is obtained by your insurance Cardiac CT Angiography (CTA), is a special type of CT scan that uses a computer to produce multi-dimensional views of major blood vessels throughout the Heart. In CT angiography, a contrast material is injected through an IV to help visualize the blood vessels   Follow-Up: At [facility], you and your health needs are our priority.  As part of our continuing mission to provide you with exceptional heart care, we have created designated Provider Care Teams.  These Care Teams include your primary Cardiologist (physician) and Advanced Practice Providers (APPs -  Physician Assistants and Nurse Practitioners) who all work together to provide you with the care you need, when you need it.  We recommend signing up for the patient portal called "MyChart".  Sign up information is provided on this After Visit Summary.  MyChart is used to connect with patients for Virtual Visits (Telemedicine).  Patients are able to view lab/test results, encounter notes, upcoming appointments, etc.  Non-urgent messages can be sent to your provider as well.   To learn more about what you can do with MyChart, go to [email or web address].    Your next appointment:   2 month(s)  The format for your next appointment:   In Person  Provider:   Glenetta Hew, MD   Other  Instructions   Your cardiac CT will be scheduled the below location:   [facility] [address] [place], [place] [address] [phone]    Please arrive at the [facility] main entrance of [facility] 30 minutes prior to test start time. Proceed to the [facility] Radiology Department (first floor) to check-in and test prep.    Please follow these instructions carefully (unless otherwise directed):  Hold all erectile dysfunction medications at least 3 days (72 hrs) prior to test.   Please have lab work BMP done one week prior to testing    On the Night Before the Test: . Be sure to Drink plenty of water. . Do not consume any caffeinated/decaffeinated beverages or chocolate 12 hours prior to your test. . Do not take any antihistamines 12 hours prior to your test. .   On the Day of the Test: . Drink plenty of water. Do not drink any water within one hour of the test. . Do not eat any food 4 hours prior to the test. . You may take your regular medications prior to the test.  . Take metoprolol (Lopressor) 100 mg two hours prior to test.        After the Test: . Drink plenty of water. . After receiving IV contrast, you may experience a mild flushed feeling. This is normal. . On occasion, you may experience a mild rash up to 24 hours after the test. This is not dangerous. If this occurs, you can take Benadryl 25 mg and  increase your fluid intake. . If you experience trouble breathing, this can be serious. If it is severe call 911 IMMEDIATELY. If it is mild, please call our office. . If you take any of these medications: Glipizide/Metformin, Avandament, Glucavance, please do not take 48 hours after completing test unless otherwise instructed.   Once we have confirmed authorization from your insurance company, we will call you to set up a date and time for your test. Based on how quickly your insurance processes prior authorizations requests, please allow  up to 4 weeks to be contacted for scheduling your Cardiac CT appointment. Be advised that routine Cardiac CT appointments could be scheduled as many as 8 weeks after your provider has ordered it.  For non-scheduling related questions, please contact the cardiac imaging nurse navigator should you have any questions/concerns: Marchia Bond, Cardiac Imaging Nurse Navigator Burley Saver, Interim Cardiac Imaging Nurse [place] and Vascular Services Direct Office Dial: [phone]   For scheduling needs, including cancellations and rescheduling, please call [place], [phone].

## 2020-04-28 ENCOUNTER — Encounter: Payer: Self-pay | Admitting: Cardiology

## 2020-04-28 NOTE — Progress Notes (Incomplete)
Primary Care Provider: [name] Infante, MD Cardiologist: Glenetta Hew, MD Electrophysiologist: None  Clinic Note: Chief Complaint  Patient presents with  . New Patient (Initial Visit)    Chest pain    HPI:    Ruben Ill. - "Ruben Logan" is a 69 y.o. male with a PMH below who is being seen today for the evaluation of CHEST PAIN at the request of [name] Infante, MD.  Problem List Items Addressed This Visit    Class 2 severe obesity due to excess calories with serious comorbidity and body mass index (BMI) of 35.0 to 35.9 in adult [facility]) (Chronic)   Relevant Orders   EKG 12-Lead (Completed)   CT CORONARY MORPH W/CTA COR W/SCORE W/CA W/CM &/OR WO/CM   CT CORONARY FRACTIONAL FLOW RESERVE DATA PREP   CT CORONARY FRACTIONAL FLOW RESERVE FLUID ANALYSIS   Basic metabolic panel   Essential hypertension (Chronic)   Relevant Medications   sildenafil (REVATIO) 20 MG tablet   Severe obstructive sleep apnea-hypopnea syndrome - Primary (Chronic)   Relevant Orders   EKG 12-Lead (Completed)   CT CORONARY MORPH W/CTA COR W/SCORE W/CA W/CM &/OR WO/CM   CT CORONARY FRACTIONAL FLOW RESERVE DATA PREP   CT CORONARY FRACTIONAL FLOW RESERVE FLUID ANALYSIS   Basic metabolic panel   Dyslipidemia, goal LDL below 100 (Chronic)   Relevant Medications   sildenafil (REVATIO) 20 MG tablet   Other Relevant Orders   CT CORONARY MORPH W/CTA COR W/SCORE W/CA W/CM &/OR WO/CM   CT CORONARY FRACTIONAL FLOW RESERVE DATA PREP   CT CORONARY FRACTIONAL FLOW RESERVE FLUID ANALYSIS   Chest pain on exertion   Relevant Orders   EKG 12-Lead (Completed)   CT CORONARY MORPH W/CTA COR W/SCORE W/CA W/CM &/OR WO/CM   CT CORONARY FRACTIONAL FLOW RESERVE DATA PREP   CT CORONARY FRACTIONAL FLOW RESERVE FLUID ANALYSIS   Basic metabolic panel   Family history of premature CAD   Relevant Orders   EKG 12-Lead (Completed)   CT CORONARY MORPH W/CTA COR W/SCORE W/CA W/CM &/OR WO/CM   CT CORONARY FRACTIONAL FLOW RESERVE DATA  PREP   CT CORONARY FRACTIONAL FLOW RESERVE FLUID ANALYSIS   Basic metabolic panel    Other Visit Diagnoses    Other chest pain       Relevant Orders   CT CORONARY FRACTIONAL FLOW RESERVE DATA PREP   CT CORONARY FRACTIONAL FLOW RESERVE FLUID ANALYSIS      [name]. was last seen back in January 2014 for evaluation of abnormal EKG suggesting prior MI. ->  Reevaluated with treadmill Myoview stress test reviewed below.  Following this test, he was lost to follow-up  Recent Hospitalizations: None  Reviewed  CV studies:    The following studies were reviewed today: (if available, images/films reviewed: From Epic Chart or Care Everywhere)  Myoview January 2014: Fair exercise capacity.  Hypertensive diastolic blood pressure.  No EKG changes consistent with ischemia.  Normal LV function.  Normal wall motion.  No ischemia or infarction.--LOW RISK   Interval History:   Ruben Ill.   CV Review of Symptoms (Summary) Cardiovascular ROS: {roscv:310661}  The patient {does/does not:200015} have symptoms concerning for COVID-19 infection (fever, chills, cough, or new shortness of breath).   REVIEWED OF SYSTEMS   Review of Systems  Constitutional: Negative for malaise/fatigue.  Gastrointestinal: Positive for abdominal pain, constipation, diarrhea and heartburn.       Back-and-forth between constipation and diarrhea  Genitourinary: Positive for  frequency and urgency.       Followed by urology.  Musculoskeletal: Positive for joint pain. Negative for falls.  Endo/Heme/Allergies: Positive for environmental allergies.  Psychiatric/Behavioral:       Significant social stress--their son Ruben Logan is very negative, not taking medications correctly, is drinking alcohol and potentially abusing other substances. He and Horris Latino are hoping to care for the grandchildren    I have reviewed and (if needed) personally updated the patient's problem list, medications, allergies, past medical and  surgical history, social and family history.   PAST MEDICAL HISTORY   Past Medical History:  Diagnosis Date  . Depression with anxiety    On Paxil  . Diabetes mellitus type II, non insulin dependent ([place])    On combination of Jardiance, Metformin, Trulicity  . Diverticulosis   . Hearing loss   . History of bladder cancer 2005  . Hx of adenomatous colonic polyps   . Hyperlipidemia    On Zetia  . Kidney stones   . OSA on CPAP 12/29/2013   Severe OSA-Dr. Dohmeier    PAST SURGICAL HISTORY   Past Surgical History:  Procedure Laterality Date  . bladder cancer  2005  . COLONOSCOPY    . Depression     On and off  . kidney stones     Presumably lithotripsy  . NM MYOVIEW LTD  05/2012   Order for abnormal EKG: [facility]):  Fair exercise capacity.  Hypertensive diastolic blood pressure.  No EKG changes consistent with ischemia.  Normal LV function.  Normal wall motion.  No ischemia or infarction.--LOW RISK    Immunization History  Administered Date(s) Administered  . Moderna SARS-COVID-2 Vaccination 03/22/2020    MEDICATIONS/ALLERGIES   Current Meds  Medication Sig  . aspirin 81 MG tablet Take 81 mg by mouth daily.  . cetirizine (ZYRTEC) 10 MG tablet Take 10 mg by mouth daily.  . Dulaglutide (TRULICITY Cole) Inject 1 Dose into the skin daily.  . empagliflozin (JARDIANCE) 25 MG TABS tablet Take 25 mg by mouth daily.  Marland Kitchen ezetimibe (ZETIA) 10 MG tablet Take 10 mg by mouth daily.  . famotidine (PEPCID) 20 MG tablet Take 20 mg by mouth daily.  . fexofenadine (ALLEGRA) 180 MG tablet Take 180 mg by mouth daily. As needed  . metFORMIN (GLUMETZA) 1000 MG (MOD) 24 hr tablet Take 1,000 mg by mouth 2 (two) times daily with a meal.   . mirabegron ER (MYRBETRIQ) 50 MG TB24 tablet Take 1 tablet by mouth daily.  Marland Kitchen OVER THE COUNTER MEDICATION 50 mg daily. CBD   . oxybutynin (DITROPAN) 5 MG tablet Take 5 mg by mouth.   Marland Kitchen PARoxetine (PAXIL) 40 MG tablet Take 40 mg by mouth daily.  . sildenafil  (REVATIO) 20 MG tablet Take by mouth.  . tamsulosin (FLOMAX) 0.4 MG CAPS capsule Take by mouth.    Allergies  Allergen Reactions  . Midazolam Other (See Comments)  . Ozempic (0.25 Or 0.5 Mg-Dose) [Semaglutide(0.25 Or 0.98m-Dos)]     Heartburn abdominal pain as well as diarrhea and irritability  . Prozac [Fluoxetine Hcl]     Made him suicidal  . Statins Itching    Rosuvastatin, pravastatin, simvastatin: Aches and pains with GI upset    SOCIAL HISTORY/FAMILY HISTORY   Reviewed in Epic:  Pertinent findings:  Social History   Tobacco Use  . Smoking status: Former Smoker    Packs/day: 1.00    Years: 46.00    Pack years: 46.00    Types: Cigarettes  Quit date: 03/16/2010    Years since quitting: 10.1  . Smokeless tobacco: Never Used  Substance Use Topics  . Alcohol use: No  . Drug use: No   Social History   Social History Narrative   Patient is married Theatre stage manager) and lives at home with his wife, and adult son. ->  They have 2 grandchildren (Cornelia Copa, 12-> significant handicap-autism spectrum and Herschel Sen[place], 26)   Patient has one adult child  - Ruben Logan, who lives with them since 2018-> He is s/p Heart Transplant (as a result of Large Anterior STEMI - LM CAD); separated from his family (divorce between 2015-2017), -> during the divorce settlement, the grandchildren stay with them every other weekend.      --> Ruben Logan has problems with EtOH and polysubstance abuse, Med Non-compliant which is a major issue in a transplant patient, starting to talk about wanting to "give up   --> there has been lots of stress for [place] trying to keep Ruben Logan on track with his medical compliance.  There is concern about possible transplant rejection, thankfully that was not the case.  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    Patient drinks four cups of caffeine daily (coffee, soda and tea).    Significant social stress with Ruben Logan being very difficult.  There was concerns about possible rejection of the transplant.  He is drinking alcohol and possibly due to substances.  Not taking his medications correctly.  Berry negative.  Serving as part of caregivers for their 2 grandchildren-very stressful  Family History  Problem Relation Age of Onset  . Heart attack Father 110       Initial MI early 29s, died as a result of his third MI)  . Coronary artery disease Father 39  . Diabetes Mellitus II Mother   . Alzheimer's disease Mother   . Coronary artery disease Son 46  . Heart attack Son 68       Left main CAD-ischemic cardiomyopathy  . Cardiomyopathy Son 27       Ischemic-ICD, now status post transplant with ICD.  Marland Kitchen Alcohol abuse Son   . Factor V Leiden deficiency Son   . Diabetes Sister   . Coronary artery disease Paternal Uncle 13  . Heart attack Paternal Uncle 23       Died as a result of CAD-MI in his late 53s  . Colon cancer Neg Hx   . Esophageal cancer Neg Hx   . Stomach cancer Neg Hx   . Rectal cancer Neg Hx     OBJCTIVE -PE, EKG, labs   Wt Readings from Last 3 Encounters:  04/24/20 215 lb 12.8 oz (97.9 kg)  11/29/19 222 lb 12.8 oz (101.1 kg)  05/29/19 224 lb (101.6 kg)    Physical Exam: BP 130/64   Pulse 72   Ht '5\' 11"'  (1.803 m)   Wt 215 lb 12.8 oz (97.9 kg)   BMI 30.10 kg/m  Physical Exam Vitals reviewed.  Constitutional:      General: He is not in acute distress.    Appearance: Normal appearance. He is obese. He is not ill-appearing or toxic-appearing.     Comments: Well-nourished, well-groomed.  HENT:     Head: Normocephalic and atraumatic.  Neck:     Vascular:  No carotid bruit, hepatojugular reflux or JVD.  Cardiovascular:     Rate and Rhythm: Normal rate and regular rhythm.  No extrasystoles are present.    Pulses: Normal pulses and intact distal pulses.     Heart sounds:  Normal heart sounds. No murmur heard.  No friction rub. No gallop.   Pulmonary:     Effort: Pulmonary effort is normal. No respiratory distress.     Breath sounds: Normal breath sounds.  Chest:     Chest wall: No tenderness.  Abdominal:     General: Bowel sounds are normal. There is no distension.     Palpations: There is no mass (No HSM).     Comments: Truncal obesity-protuberant  Musculoskeletal:     Cervical back: Normal range of motion and neck supple.  Skin:    General: Skin is warm and dry.  Neurological:     General: No focal deficit present.     Mental Status: He is alert and oriented to person, place, and time.     Cranial Nerves: No cranial nerve deficit.     Gait: Gait normal.  Psychiatric:        Mood and Affect: Mood normal.        Behavior: Behavior normal.        Thought Content: Thought content normal.        Judgment: Judgment normal.     Comments: Pleasant      Adult ECG Report  Rate: 72 ;  Rhythm: normal sinus rhythm and Normal axis and intervals durations.;   Narrative Interpretation: Normal EKG  Recent Labs: 03/14/2020  Na+ 143, K+ 5.2, Cl- 105, HCO3-25, BUN 12, Cr 1.0, Glu 198, Ca2+ 10.4; AST 22, ALT 23, AlkP 66  CBC: W 5.42, H/H 15.8/46.6, Plt 198  TC 177, TG 118, HDL 68, LDL 85; apolipoprotein B 81 mL A1c 8.9; TSH 0.9 with free T4 of 0.9. No results found for: CHOL, HDL, LDLCALC, LDLDIRECT, TRIG, CHOLHDL No results found for: CREATININE, BUN, NA, K, CL, CO2 No results found for: TSH  ASSESSMENT/PLAN    Problem List Items Addressed This Visit    Class 2 severe obesity due to excess calories with serious comorbidity and body mass index (BMI) of 35.0 to 35.9 in adult ([place]) (Chronic)   Relevant Orders   EKG 12-Lead (Completed)   CT CORONARY MORPH W/CTA COR W/SCORE W/CA W/CM &/OR WO/CM   CT CORONARY FRACTIONAL FLOW RESERVE DATA PREP   CT CORONARY FRACTIONAL FLOW RESERVE FLUID ANALYSIS   Basic metabolic panel   Essential hypertension (Chronic)    Relevant Medications   sildenafil (REVATIO) 20 MG tablet   Severe obstructive sleep apnea-hypopnea syndrome - Primary (Chronic)   Relevant Orders   EKG 12-Lead (Completed)   CT CORONARY MORPH W/CTA COR W/SCORE W/CA W/CM &/OR WO/CM   CT CORONARY FRACTIONAL FLOW RESERVE DATA PREP   CT CORONARY FRACTIONAL FLOW RESERVE FLUID ANALYSIS   Basic metabolic panel   Dyslipidemia, goal LDL below 100 (Chronic)   Relevant Medications   sildenafil (REVATIO) 20 MG tablet   Other Relevant Orders   CT CORONARY MORPH W/CTA COR W/SCORE W/CA W/CM &/OR WO/CM   CT CORONARY FRACTIONAL FLOW RESERVE DATA PREP   CT CORONARY FRACTIONAL FLOW RESERVE FLUID ANALYSIS   Chest pain on exertion   Relevant Orders   EKG 12-Lead (Completed)   CT CORONARY MORPH W/CTA COR W/SCORE W/CA W/CM &/OR WO/CM   CT CORONARY FRACTIONAL FLOW RESERVE DATA PREP   CT  CORONARY FRACTIONAL FLOW RESERVE FLUID ANALYSIS   Basic metabolic panel   Family history of premature CAD   Relevant Orders   EKG 12-Lead (Completed)   CT CORONARY MORPH W/CTA COR W/SCORE W/CA W/CM &/OR WO/CM   CT CORONARY FRACTIONAL FLOW RESERVE DATA PREP   CT CORONARY FRACTIONAL FLOW RESERVE FLUID ANALYSIS   Basic metabolic panel    Other Visit Diagnoses    Other chest pain       Relevant Orders   CT CORONARY FRACTIONAL FLOW RESERVE DATA PREP   CT CORONARY FRACTIONAL FLOW RESERVE FLUID ANALYSIS     COVID-19 Education: The signs and symptoms of COVID-19 were discussed with the patient and how to seek care for testing (follow up with PCP or arrange E-visit).   The importance of social distancing and COVID-19 vaccination was discussed today. 1 min The patient is practicing social distancing & Masking.   I spent a total of 41mnutes with the patient spent in direct patient consultation.  Additional time spent with chart review  / charting (studies, outside notes, etc): 35 [facility], labs updated,-history reviewed in epic and Care Everywhere Total  Time: 62 min   Current medicines are reviewed at length with the patient today.  (+/- concerns) ***  This visit occurred during the SARS-CoV-2 public health emergency.  Safety protocols were in place, including screening questions prior to the visit, additional usage of staff PPE, and extensive cleaning of exam room while observing appropriate contact time as indicated for disinfecting solutions.  Notice: This dictation was prepared with Dragon dictation along with smaller phrase technology. Any transcriptional errors that result from this process are unintentional and may not be corrected upon review.  Patient Instructions / Medication Changes & Studies & Tests Ordered   Patient Instructions  Medication Instructions:  See instruction sheet *If you need a refill on your cardiac medications before your next appointment, please call your pharmacy*   Lab Work: See instruction sheet   If you have labs (blood work) drawn today and your tests are completely normal, you will receive your results only by: .Marland KitchenMyChart Message (if you have MyChart) OR . A paper copy in the mail If you have any lab test that is abnormal or we need to change your treatment, we will call you to review the results.   Testing/Procedures: [facility] once authorization is obtained by your insurance Cardiac CT Angiography (CTA), is a special type of CT scan that uses a computer to produce multi-dimensional views of major blood vessels throughout the Heart. In CT angiography, a contrast material is injected through an IV to help visualize the blood vessels   Follow-Up: [facility] you and your health needs are our priority.  As part of our continuing mission to provide you with exceptional heart care, we have created designated Provider Care Teams.  These Care Teams include your primary Cardiologist (physician) and Advanced Practice Providers (APPs -   Physician Assistants and Nurse Practitioners) who all work together to provide you with the care you need, when you need it.  We recommend signing up for the patient portal called "MyChart".  Sign up information is provided on this After Visit Summary.  MyChart is used to connect with patients for Virtual Visits (Telemedicine).  Patients are able to view lab/test results, encounter notes, upcoming appointments, etc.  Non-urgent messages can be sent to your provider as well.  To learn more about what you can do with MyChart, go to [email or web address].    Your next appointment:   2 month(s)  The format for your next appointment:   In Person  Provider:   Glenetta Hew, MD   Other Instructions   Your cardiac CT will be scheduled the below location:   [facility] [address] [place], [place] [address] [phone]    Please arrive at the [facility] main entrance of [facility] 30 minutes prior to test start time. Proceed to the [facility] Radiology Department (first floor) to check-in and test prep.    Please follow these instructions carefully (unless otherwise directed):  Hold all erectile dysfunction medications at least 3 days (72 hrs) prior to test.   Please have lab work BMP done one week prior to testing    On the Night Before the Test: . Be sure to Drink plenty of water. . Do not consume any caffeinated/decaffeinated beverages or chocolate 12 hours prior to your test. . Do not take any antihistamines 12 hours prior to your test. .   On the Day of the Test: . Drink plenty of water. Do not drink any water within one hour of the test. . Do not eat any food 4 hours prior to the test. . You may take your regular medications prior to the test.  . Take metoprolol (Lopressor) 100 mg two hours prior to test.        After the Test: . Drink plenty of water. . After receiving IV contrast, you may experience a mild flushed feeling. This is  normal. . On occasion, you may experience a mild rash up to 24 hours after the test. This is not dangerous. If this occurs, you can take Benadryl 25 mg and increase your fluid intake. . If you experience trouble breathing, this can be serious. If it is severe call 911 IMMEDIATELY. If it is mild, please call our office. . If you take any of these medications: Glipizide/Metformin, Avandament, Glucavance, please do not take 48 hours after completing test unless otherwise instructed.   Once we have confirmed authorization from your insurance company, we will call you to set up a date and time for your test. Based on how quickly your insurance processes prior authorizations requests, please allow up to 4 weeks to be contacted for scheduling your Cardiac CT appointment. Be advised that routine Cardiac CT appointments could be scheduled as many as 8 weeks after your provider has ordered it.  For non-scheduling related questions, please contact the cardiac imaging nurse navigator should you have any questions/concerns: Marchia Bond, Cardiac Imaging Nurse Navigator Burley Saver, Interim Cardiac Imaging Nurse [place] and Vascular Services Direct Office Dial: [phone]   For scheduling needs, including cancellations and rescheduling, please call [place], [phone].       Studies Ordered:   Orders Placed This Encounter  Procedures  . CT CORONARY MORPH W/CTA COR W/SCORE W/CA W/CM &/OR WO/CM  . CT CORONARY FRACTIONAL FLOW RESERVE DATA PREP  . CT CORONARY FRACTIONAL FLOW RESERVE FLUID ANALYSIS  . Basic metabolic panel  . EKG 12-Lead     Glenetta Hew, M.D., M.S. Interventional Cardiologist   Pager # [phone] Phone # [phone] [address] [place], [place] [address]   Thank you for choosing Heartcare at [facility]!!

## 2020-04-29 ENCOUNTER — Encounter: Payer: Self-pay | Admitting: Cardiology

## 2020-04-29 NOTE — Assessment & Plan Note (Signed)
On CPAP.  This could be contributing to some dyspnea but less likely.

## 2020-04-29 NOTE — Assessment & Plan Note (Signed)
Chest pain and dyspnea with exertion.  Is really chest discomfort happens when breathing hard.  Not sure if this musculoskeletal anginal related.  However based on the progressive exertional dyspnea, there is a least moderate potential ischemic CAD.  Plan: Check Coronary CTA-FFR

## 2020-04-29 NOTE — Assessment & Plan Note (Signed)
Blood pressure looks pretty good on no medicines at present.

## 2020-04-29 NOTE — Assessment & Plan Note (Addendum)
Pretty significant history of premature CAD.  I do not know that his son had an embolic event from [place], but I would imagine that his father and uncle both had regular CAD.  He is now complaining about chest discomfort that is mostly at rest, but also may be with associated exertional dyspnea.  Would like to get a baseline cardiovascular risk assessment of his coronary anatomy and potential ischemia.  Plan: Check Coronary CTA with CT FFR  Adjust glycemic and lipid control based on coronary CTA findings.

## 2020-04-29 NOTE — Assessment & Plan Note (Signed)
At present, goal LDL is less than 100 just based on his risk factors of age and family history.  Now with him having exertional dyspnea, we have been evaluating for ischemic CAD and may change the target level.  Presently, lipids are at goal with an LDL of 85 on Zetia alone.

## 2020-05-08 LAB — BASIC METABOLIC PANEL
BUN/Creatinine Ratio: 14 (ref 10–24)
BUN: 13 mg/dL (ref 8–27)
CO2: 22 mmol/L (ref 20–29)
Calcium: 9.7 mg/dL (ref 8.6–10.2)
Chloride: 103 mmol/L (ref 96–106)
Creatinine, Ser: 0.96 mg/dL (ref 0.76–1.27)
GFR calc Af Amer: 92 mL/min/{1.73_m2} (ref >59)
GFR calc non Af Amer: 79 mL/min/{1.73_m2} (ref >59)
Glucose: 171 mg/dL — ABNORMAL HIGH (ref 65–99)
Potassium: 4.5 mmol/L (ref 3.5–5.2)
Sodium: 140 mmol/L (ref 134–144)

## 2020-05-09 ENCOUNTER — Telehealth (HOSPITAL_COMMUNITY): Payer: Self-pay | Admitting: Emergency Medicine

## 2020-05-09 NOTE — Telephone Encounter (Signed)
Attempted to call patient regarding upcoming cardiac CT appointment. °Left message on voicemail with name and callback number °Xavius Spadafore RN Navigator Cardiac Imaging °[facility] °[phone] Office °[phone] Cell ° °

## 2020-05-10 ENCOUNTER — Telehealth (HOSPITAL_COMMUNITY): Payer: Self-pay | Admitting: Emergency Medicine

## 2020-05-10 NOTE — Telephone Encounter (Signed)
Reaching out to patient to offer assistance regarding upcoming cardiac imaging study; pt verbalizes understanding of appt date/time, parking situation and where to check in, pre-test NPO status and medications ordered, and verified current allergies; name and call back number provided for further questions should they arise Marchia Bond RN Navigator Cardiac Imaging [facility] [phone] office [phone] cell  Pt instructed to avoid allergy meds and ED medications. Taking 100mg  metop 2 hr prior to scan  Ruben Logan

## 2020-05-13 ENCOUNTER — Ambulatory Visit (HOSPITAL_COMMUNITY)
Admission: RE | Admit: 2020-05-13 | Discharge: 2020-05-13 | Disposition: A | Discharge status: Home / Self Care | Payer: Medicare PPO | Source: Ambulatory Visit | Attending: Cardiology | Admitting: Cardiology

## 2020-05-13 ENCOUNTER — Other Ambulatory Visit: Payer: Self-pay

## 2020-05-13 ENCOUNTER — Encounter (HOSPITAL_COMMUNITY): Payer: Self-pay

## 2020-05-13 DIAGNOSIS — G4733 Obstructive sleep apnea (adult) (pediatric): Secondary | ICD-10-CM | POA: Insufficient documentation

## 2020-05-13 DIAGNOSIS — Z8249 Family history of ischemic heart disease and other diseases of the circulatory system: Secondary | ICD-10-CM | POA: Diagnosis present

## 2020-05-13 DIAGNOSIS — I208 Other forms of angina pectoris: Secondary | ICD-10-CM | POA: Diagnosis not present

## 2020-05-13 DIAGNOSIS — Z6835 Body mass index (BMI) 35.0-35.9, adult: Secondary | ICD-10-CM | POA: Diagnosis present

## 2020-05-13 DIAGNOSIS — R079 Chest pain, unspecified: Secondary | ICD-10-CM | POA: Diagnosis not present

## 2020-05-13 DIAGNOSIS — E785 Hyperlipidemia, unspecified: Secondary | ICD-10-CM | POA: Diagnosis present

## 2020-05-13 DIAGNOSIS — R0600 Dyspnea, unspecified: Secondary | ICD-10-CM | POA: Diagnosis not present

## 2020-05-13 MED ORDER — NITROGLYCERIN 0.4 MG SL SUBL
0.8000 mg | SUBLINGUAL_TABLET | Freq: Once | SUBLINGUAL | Status: AC
  Administered 2020-05-13: 08:00:00 0.8 mg via SUBLINGUAL

## 2020-05-13 MED ORDER — NITROGLYCERIN 0.4 MG SL SUBL
SUBLINGUAL_TABLET | SUBLINGUAL | Status: AC
  Filled 2020-05-13: qty 2

## 2020-05-13 MED ORDER — IOHEXOL 350 MG/ML SOLN
80.0000 mL | Freq: Once | INTRAVENOUS | Status: AC | PRN
  Administered 2020-05-13: 09:00:00 80 mL via INTRAVENOUS

## 2020-06-02 NOTE — Progress Notes (Signed)
PATIENT: Ruben Logan. DOB: 05-May-1951  REASON FOR VISIT: follow up HISTORY FROM: patient  HISTORY OF PRESENT ILLNESS: Today 06/02/20:  Ruben Logan is a 70 year old male with a history of obstructive sleep apnea on CPAP.  His download indicates that he uses machine 28 out of 30 days for compliance of 93%.  He uses machine greater than 4 hours each night.  On average he uses his machine 8 hours and 5 minutes.  His residual AHI is 7.6 on 8 to 12 cm of water.  His leak in the 95th percentile is 45.1 L/min.  His pressure in the 95th percentile is 11.8 with a maximum pressure of 11.9.  He returns today for an evaluation.  11/29/19: Ruben Logan is a 69 year old male with a history of obstructive sleep apnea on CPAP.  His download indicates that he uses machine nightly for compliance of 100%.  He uses machine greater than 4 hours 29 days for compliance of 97%.  On average he uses his machine 7 hours and 38 minutes.  His residual AHI is 6.7 on 8 to 12 cm of water with EPR 1.  Leak in the 95th percentile is 49.8 L/min.  Reports that he tends to lay on the right side and this is when his mask leaks.  HISTORY  (Copied from Dr.Dohmeier's note) Ruben Logan is a 70 y.o. 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 He is a longtime established CPAP patient.  He has had a dramatic history with his son's illness during 2020, who is now at [facility] and has revoked his DNR order.  We repeated a home sleep test on July 15 of last year to see if the patient still requires CPAP and he still had severe sleep apnea with an AHI of 52.8 RDI of 58.5/h bradycardia and tachycardia and sleep-related hypoxemia for 22.3 minutes.  I ordered an auto titration capable new CPAP machine with a set pressure window between 8 cmH2O and 14 cmH2O and 3 cm EPR.  The study was performed on 7-15 2020 shortly after his birthday.  The patient has remained a very compliant CPAP user as I had expected.  His  compliance is 100% for the time up to late October 2020 and for the last 30 days is 90 %.  His AHI became elevated to 7/h from only 4.4/h on 11 cm water set pressure. autoCPAP has been not easy for him.  He reduced the humidifier to low grade.  My suggestion for Mr. [name] is to reduce the AutoSet from 8 through 14-8 through 12 cmH2O the EPR will stay at 1 cmH2O and my goal is to avoid central apneas from too high of a pressure of 4.  Epworth Sleepiness Scale was endorsed at 9 points fatigue severity at 28 points-  The patient is using oxybutynin to control nocturia and urinary flow. He reports a dry mouth.    REVIEW OF SYSTEMS: Out of a complete 14 system review of symptoms, the patient complains only of the following symptoms, and all other reviewed systems are negative.  FSS 14 ESS 5  ALLERGIES: Allergies  Allergen Reactions   Midazolam Other (See Comments)   Ozempic (0.25 Or 0.5 Mg-Dose) [Semaglutide(0.25 Or 0.5mg -Dos)]     Heartburn abdominal pain as well as diarrhea and irritability   Prozac [Fluoxetine Hcl]     Made him suicidal   Statins Itching    Rosuvastatin, pravastatin, simvastatin: Aches and pains with  GI upset    HOME MEDICATIONS: Outpatient Medications Prior to Visit  Medication Sig Dispense Refill   aspirin 81 MG tablet Take 81 mg by mouth daily.     cetirizine (ZYRTEC) 10 MG tablet Take 10 mg by mouth daily.     Dulaglutide (TRULICITY [place]) Inject 1 Dose into the skin daily.     empagliflozin (JARDIANCE) 25 MG TABS tablet Take 25 mg by mouth daily.     ezetimibe (ZETIA) 10 MG tablet Take 10 mg by mouth daily.     famotidine (PEPCID) 20 MG tablet Take 20 mg by mouth daily.     fexofenadine (ALLEGRA) 180 MG tablet Take 180 mg by mouth daily. As needed     metFORMIN (GLUMETZA) 1000 MG (MOD) 24 hr tablet Take 1,000 mg by mouth 2 (two) times daily with a meal.      metoprolol tartrate (LOPRESSOR) 50 MG tablet Take 2 tablets (100 mg total) by mouth once for 1  dose. TAKE TWO HOURS PRIOR TO  SCHEDULE CARDIAC TEST 2 tablet 0   mirabegron ER (MYRBETRIQ) 50 MG TB24 tablet Take 1 tablet by mouth daily.     OVER THE COUNTER MEDICATION 50 mg daily. CBD      oxybutynin (DITROPAN) 5 MG tablet Take 5 mg by mouth.      PARoxetine (PAXIL) 40 MG tablet Take 40 mg by mouth daily.     sildenafil (REVATIO) 20 MG tablet Take by mouth.     tamsulosin (FLOMAX) 0.4 MG CAPS capsule Take by mouth.     No facility-administered medications prior to visit.    PAST MEDICAL HISTORY: Past Medical History:  Diagnosis Date   Depression with anxiety    On Paxil   Diabetes mellitus type II, non insulin dependent ([place])    On combination of Jardiance, Metformin, Trulicity   Diverticulosis    Hearing loss    History of bladder cancer 2005   Hx of adenomatous colonic polyps    Hyperlipidemia    On Zetia   Kidney stones    OSA on CPAP 12/29/2013   Severe OSA-Dr. Brett Fairy    PAST SURGICAL HISTORY: Past Surgical History:  Procedure Laterality Date   bladder cancer  2005   COLONOSCOPY     Depression     On and off   kidney stones     Presumably lithotripsy   NM [facility]  05/2012   Order for abnormal EKG: [facility]):  Fair exercise capacity.  Hypertensive diastolic blood pressure.  No EKG changes consistent with ischemia.  Normal LV function.  Normal wall motion.  No ischemia or infarction.--LOW RISK    FAMILY HISTORY: Family History  Problem Relation Age of Onset   Heart attack Father 62       Initial MI early 108s, died as a result of his third MI)   Coronary artery disease Father 80   Diabetes Mellitus II Mother    Alzheimer's disease Mother    Coronary artery disease Son 38   Heart attack Son 46       Left main CAD-ischemic cardiomyopathy   Cardiomyopathy Son 35       Ischemic-ICD, now status post transplant with ICD.   Alcohol abuse Son    Factor V Leiden deficiency Son    Diabetes Sister    Coronary artery disease  Paternal Uncle 71   Heart attack Paternal Uncle 72       Died as a result of CAD-MI in his late 26s  Colon cancer Neg Hx    Esophageal cancer Neg Hx    Stomach cancer Neg Hx    Rectal cancer Neg Hx     SOCIAL HISTORY: Social History   Socioeconomic History   Marital status: Married    Spouse name: Horris Latino   Number of children: 1   Years of education: 14   Highest education level: Not on file  Occupational History   Not on file  Tobacco Use   Smoking status: Former Smoker    Packs/day: 1.00    Years: 46.00    Pack years: 46.00    Types: Cigarettes    Quit date: 03/16/2010    Years since quitting: 10.2   Smokeless tobacco: Never Used  Substance and Sexual Activity   Alcohol use: No   Drug use: No   Sexual activity: Not on file  Other Topics Concern   Not on file  Social History Narrative   Patient is married Theatre stage manager) and lives at home with his wife, and adult son. ->  They have 2 grandchildren (Cornelia Copa, 12-> significant handicap-autism spectrum and Herschel [place], 6)   Patient has one adult child  - [name], who lives with them since 2018-> He is s/p Heart Transplant (as a result of Large Anterior STEMI - LM CAD); separated from his family (divorce between 2015-2017), -> during the divorce settlement, the grandchildren stay with them every other weekend.      --> [name] has problems with EtOH and polysubstance abuse, Med Non-compliant which is a major issue in a transplant patient, starting to talk about wanting to "give up   --> there has been lots of stress for Br[place]ley trying to keep [name] on track with his medical compliance.  There is concern about possible transplant rejection, thankfully that was not the case.  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   Patient drinks four cups of  caffeine daily (coffee, soda and tea).      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   Social Determinants of Health   Financial Resource Strain: Not on file  Food Insecurity: Not on file  Transportation Needs: Not on file  Physical Activity: Not on file  Stress: Not on file  Social Connections: Not on file  Intimate Partner Violence: Not on file      PHYSICAL EXAM  Vitals:   06/03/20 0905  BP: 130/74  Pulse: 79  Weight: 207 lb (93.9 kg)  Height: 5\' 11"  (1.803 m)   Body mass index is 28.87 kg/m.  Generalized: Well developed, in no acute distress  Chest: Lungs clear to auscultation bilaterally  Neurological examination  Mentation: Alert oriented to time, place, history taking. Follows all commands speech and language fluent Cranial nerve II-XII: Extraocular movements were full, visual field were full on confrontational test Head turning and shoulder shrug  were normal and symmetric. Motor: The motor testing reveals 5 over 5 strength of all 4 extremities. Good symmetric motor tone is noted throughout.  Sensory: Sensory testing is intact to soft touch on all 4 extremities. No evidence of extinction is noted.  Gait and station: Gait is normal.    DIAGNOSTIC DATA (LABS, IMAGING, TESTING) -  I reviewed patient records, labs, notes, testing and imaging myself where available.     ASSESSMENT AND PLAN 70 y.o. year old male  has a past medical history of Depression with anxiety, Diabetes mellitus type II, non insulin dependent ([place]), Diverticulosis, Hearing loss, History of bladder cancer (2005), adenomatous colonic polyps, Hyperlipidemia, Kidney stones, and OSA on CPAP (12/29/2013). here with:  1. OSA on CPAP  - CPAP compliance excellent -Residual AHI slightly elevated.  Will increase pressure 8 to 15 cm of water -Encouraged the patient to try to put straps tighter on  the right side in order to fix leak. - Encourage patient to use CPAP nightly and > 4 hours each night - F/U in 1 year or sooner if needed   I spent 20 minutes of face-to-face and non-face-to-face time with patient.  This included previsit chart review, lab review, study review, order entry, electronic health record documentation, patient education.  Ward Givens, MSN, NP-C 06/02/2020, 4:04 PM [facility] [address], [place], [place] [address] [phone]

## 2020-06-03 ENCOUNTER — Ambulatory Visit: Payer: Medicare PPO | Admitting: Adult Health

## 2020-06-03 ENCOUNTER — Other Ambulatory Visit: Payer: Self-pay

## 2020-06-03 ENCOUNTER — Encounter: Payer: Self-pay | Admitting: Adult Health

## 2020-06-03 VITALS — BP 130/74 | HR 79 | Ht 71.0 in | Wt 207.0 lb

## 2020-06-03 DIAGNOSIS — G4733 Obstructive sleep apnea (adult) (pediatric): Secondary | ICD-10-CM

## 2020-06-03 DIAGNOSIS — Z9989 Dependence on other enabling machines and devices: Secondary | ICD-10-CM

## 2020-06-03 NOTE — Patient Instructions (Signed)
Continue using CPAP nightly and greater than 4 hours each night Will increase pressure 8 to 15 cm of water Make sure straps are tight to avoid the mask leaking If your symptoms worsen or you develop new symptoms please let us know.

## 2020-06-03 NOTE — Progress Notes (Signed)
community message sent to [facility]. 

## 2020-06-21 DIAGNOSIS — R030 Elevated blood-pressure reading, without diagnosis of hypertension: Secondary | ICD-10-CM | POA: Diagnosis not present

## 2020-06-21 DIAGNOSIS — E785 Hyperlipidemia, unspecified: Secondary | ICD-10-CM | POA: Diagnosis not present

## 2020-06-21 DIAGNOSIS — F329 Major depressive disorder, single episode, unspecified: Secondary | ICD-10-CM | POA: Diagnosis not present

## 2020-06-21 DIAGNOSIS — N401 Enlarged prostate with lower urinary tract symptoms: Secondary | ICD-10-CM | POA: Diagnosis not present

## 2020-06-21 DIAGNOSIS — E1165 Type 2 diabetes mellitus with hyperglycemia: Secondary | ICD-10-CM | POA: Diagnosis not present

## 2020-06-21 DIAGNOSIS — C679 Malignant neoplasm of bladder, unspecified: Secondary | ICD-10-CM | POA: Diagnosis not present

## 2020-06-21 DIAGNOSIS — K219 Gastro-esophageal reflux disease without esophagitis: Secondary | ICD-10-CM | POA: Diagnosis not present

## 2020-06-21 DIAGNOSIS — G4733 Obstructive sleep apnea (adult) (pediatric): Secondary | ICD-10-CM | POA: Diagnosis not present

## 2020-06-26 ENCOUNTER — Other Ambulatory Visit: Payer: Self-pay

## 2020-06-26 ENCOUNTER — Encounter: Payer: Self-pay | Admitting: Cardiology

## 2020-06-26 ENCOUNTER — Ambulatory Visit: Payer: Medicare PPO | Admitting: Cardiology

## 2020-06-26 VITALS — BP 130/62 | HR 72 | Ht 71.0 in | Wt 212.2 lb

## 2020-06-26 DIAGNOSIS — Z8249 Family history of ischemic heart disease and other diseases of the circulatory system: Secondary | ICD-10-CM | POA: Diagnosis not present

## 2020-06-26 DIAGNOSIS — R079 Chest pain, unspecified: Secondary | ICD-10-CM

## 2020-06-26 DIAGNOSIS — E785 Hyperlipidemia, unspecified: Secondary | ICD-10-CM

## 2020-06-26 DIAGNOSIS — I10 Essential (primary) hypertension: Secondary | ICD-10-CM | POA: Diagnosis not present

## 2020-06-26 NOTE — Progress Notes (Signed)
Primary Care Provider: [name], MD Cardiologist: Bryan Lemma, MD Electrophysiologist: None  Clinic Note: Chief Complaint  Patient presents with  . Follow-up    Test results  . Chest Pain    No further episodes   ===================================  ASSESSMENT/PLAN   Problem List Items Addressed This Visit    Essential hypertension (Chronic)    Blood pressures been stable.  Not currently on any medications.  Continue lifestyle modification      Family history of premature CAD (Chronic)    Despite a pretty significant family history premature CAD, he does not have evidence of notable CAD on Coronary CTA      Dyslipidemia, goal LDL below 100 (Chronic)    He is on Zetia.  His goal based on coronary calcium score should be an LDL of 100 or less.  Has not been checked in over a year, but was 101 in October 2020  -Defer to PCP.      Chest pain with low risk for cardiac etiology - Primary    He is no longer having chest pain.  Seem to be associated with social stress.  Essentially normal coronary CTA.  Very unlikely that his pain is related to cardiac etiology.        ===================================  HPI:    Ruben Rhame. is a 70 y.o. male Former Smoker with a PMH notable for HTN and HLD along with strong family history of CAD who presents today for 77-month follow-up after initial evaluation for chest pain.   Ruben Hua. was seen in initial consultation on 04/24/2020 at the request of Dr. Tacey Heap was chest pain tightness not necessarily associate with exertion.  Previous evaluation in January 2014 was with a nonischemic Myoview. => He had lots of stress going on with his son is status post heart transplant, and is not doing well (his son has factor V Leiden deficiency and suffered his first MI at age 43 leading to severe STEMI cardiomyopathy -> since that time he has been troubled with psychological issues, and has moved in with his  parents).  Having his son constantly causing stress quite literally is leading to have chest pain.  He also describes off-and-on fluttering sensations.  Symptoms are relatively short-lived lasting a minute or 2. => Ischemic evaluation with Coronary CTA; considered monitor if symptoms persist with negative ischemic evaluation.  Recent Hospitalizations: None  Reviewed  CV studies:    The following studies were reviewed today: (if available, images/films reviewed: From Epic Chart or Care Everywhere) . Cardiac CTA 05/13/2020: Coronary Calcium Score of 0.  Low risk.; Normal coronary origin with RCA dominance.; No evidence of CAD. Mild tapering of the LAD at D2  branch.; CAD-RADS 0. No evidence of CAD (0%). Consider non-atherosclerotic causes of chest pain.  Interval History:   Ruben Logan. returns here today to discuss results of his test.  He has not had any further episodes of chest pain.  He still has fluttering off and on, but can easily correlate them with the stress from his son.  No prolonged tachycardia, spells do not last more than a minute or 2.  Now that he has had very reassuring news on his coronary calcium score and CT scan, he is no longer distressed about any episodes of chest discomfort.  He was quite reassured.  He is trying to take steps to keep himself healthy and trying to avoid stress.  CV Review of Symptoms (Summary): no chest pain  or dyspnea on exertion positive for - palpitations and May be some exertional dyspnea if he overexerts, but not routine negative for - edema, orthopnea, paroxysmal nocturnal dyspnea, shortness of breath or Lightheadedness, dizziness, syncope/near syncope or TIA/amaurosis fugax, claudication  The patient does not have symptoms concerning for COVID-19 infection (fever, chills, cough, or new shortness of breath).   REVIEWED OF SYSTEMS   Review of Systems  Constitutional: Negative for malaise/fatigue (Energy is okay).  HENT: Negative for  congestion and nosebleeds.   Respiratory: Negative for shortness of breath.   Gastrointestinal: Positive for abdominal pain and constipation. Negative for blood in stool and melena.  Genitourinary: Positive for frequency (Nocturia).  Musculoskeletal: Positive for joint pain. Negative for falls.  Neurological: Negative for dizziness (If he bends over then stands up quickly), focal weakness and weakness.  Endo/Heme/Allergies: Positive for environmental allergies.  Psychiatric/Behavioral: Negative for depression and memory loss. The patient is nervous/anxious and has insomnia.        Lots of social stress still present.  He is working on ways of handling it better.   I have reviewed and (if needed) personally updated the patient's problem list, medications, allergies, past medical and surgical history, social and family history.   PAST MEDICAL HISTORY   Past Medical History:  Diagnosis Date  . Depression with anxiety    On Paxil  . Diabetes mellitus type II, non insulin dependent ([place])    On combination of Jardiance, Metformin, Trulicity  . Diverticulosis   . Hearing loss   . History of bladder cancer 2005  . Hx of adenomatous colonic polyps   . Hyperlipidemia    On Zetia  . Kidney stones   . Left-sided chest wall pain 04/2020   Coronary CTA: Coronary Calcium Score of 0.  Low risk.; Normal coronary origin with RCA dominance.; No evidence of CAD. Mild tapering of the LAD at D2  branch.; CAD-RADS 0. No evidence of CAD (0%). Consider non-atherosclerotic causes of chest pain.  . OSA on CPAP 12/29/2013   Severe OSA-Dr. Dohmeier    PAST SURGICAL HISTORY   Past Surgical History:  Procedure Laterality Date  . bladder cancer  2005  . COLONOSCOPY    . Depression     On and off  . kidney stones     Presumably lithotripsy  . NM MYOVIEW LTD  05/2012   Order for abnormal EKG: [facility]):  Fair exercise capacity.  Hypertensive diastolic blood pressure.  No EKG changes consistent with ischemia.   Normal LV function.  Normal wall motion.  No ischemia or infarction.--LOW RISK    Immunization History  Administered Date(s) Administered  . Moderna Sars-Covid-2 Vaccination 03/22/2020    MEDICATIONS/ALLERGIES   Current Meds  Medication Sig  . aspirin 81 MG tablet Take 81 mg by mouth daily.  . cetirizine (ZYRTEC) 10 MG tablet Take 10 mg by mouth daily.  . Dulaglutide (TRULICITY [place]) Inject 1 Dose into the skin once a week.  . empagliflozin (JARDIANCE) 25 MG TABS tablet Take 25 mg by mouth daily.  Marland Kitchen ezetimibe (ZETIA) 10 MG tablet Take 10 mg by mouth daily.  . famotidine (PEPCID) 20 MG tablet Take 20 mg by mouth daily.  . fexofenadine (ALLEGRA) 180 MG tablet Take 180 mg by mouth daily. As needed  . metFORMIN (GLUMETZA) 1000 MG (MOD) 24 hr tablet Take 1,000 mg by mouth 2 (two) times daily with a meal.   . mirabegron ER (MYRBETRIQ) 50 MG TB24 tablet Take 1 tablet by mouth daily.  Marland Kitchen  OVER THE COUNTER MEDICATION 50 mg daily. CBD  . oxybutynin (DITROPAN) 5 MG tablet Take 5 mg by mouth.   Marland Kitchen PARoxetine (PAXIL) 40 MG tablet Take 40 mg by mouth daily.  Marland Kitchen Respiratory Therapy Supplies (CARETOUCH 2 CPAP HOSE HANGER) MISC Wears it nightly. 11 cm h20 pressure setting  . sildenafil (REVATIO) 20 MG tablet Take by mouth.  . tamsulosin (FLOMAX) 0.4 MG CAPS capsule Take by mouth.    Allergies  Allergen Reactions  . Midazolam Other (See Comments)  . Ozempic (0.25 Or 0.5 Mg-Dose) [Semaglutide(0.25 Or 0.5mg -Dos)]     Heartburn abdominal pain as well as diarrhea and irritability  . Prozac [Fluoxetine Hcl]     Made him suicidal  . Statins Itching    Rosuvastatin, pravastatin, simvastatin: Aches and pains with GI upset    SOCIAL HISTORY/FAMILY HISTORY   Reviewed in Epic:  Pertinent findings:  Social History   Tobacco Use  . Smoking status: Former Smoker    Packs/day: 1.00    Years: 46.00    Pack years: 46.00    Types: Cigarettes    Quit date: 03/16/2010    Years since quitting: 10.3  .  Smokeless tobacco: Never Used  Substance Use Topics  . Alcohol use: No  . Drug use: No   Social History   Social History Narrative   Patient is married Theatre stage manager) and lives at home with his wife, and adult son. ->  They have 2 grandchildren (Cornelia Copa, 12-> significant handicap-autism spectrum and Herschel [place], 67)   Patient has one adult child  - [name], who lives with them since 2018-> He is s/p Heart Transplant (as a result of Large Anterior STEMI - LM CAD); separated from his family (divorce between 2015-2017), -> during the divorce settlement, the grandchildren stay with them every other weekend.      --> [name] has problems with EtOH and polysubstance abuse, Med Non-compliant which is a major issue in a transplant patient, starting to talk about wanting to "give up   --> there has been lots of stress for [place] trying to keep [name] on track with his medical compliance.  There is concern about possible transplant rejection, thankfully that was not the case.  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   Patient drinks four cups of caffeine daily (coffee, soda and tea).      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    Appearance: Normal appearance. He is obese. He is not ill-appearing or  toxic-appearing.     Comments: Borderline obese; well-groomed.  HENT:     Head: Normocephalic and atraumatic.  Cardiovascular:  Rate and Rhythm: Normal rate and regular rhythm. Occasional extrasystoles are present.    Chest Wall: PMI is not displaced.     Pulses: Normal pulses.     Heart sounds: S1 normal and S2 normal. Heart sounds are distant. No murmur heard. No friction rub. No gallop.   Pulmonary:     Effort: Pulmonary effort is normal. No respiratory distress.     Breath sounds: Normal breath sounds.  Chest:     Chest wall: No tenderness.  Musculoskeletal:        General: Swelling (Trivial) present. Normal range of motion.  Skin:    General: Skin is warm and dry.  Neurological:     General: No focal deficit present.     Mental Status: He is alert and oriented to person, place, and time.     Motor: No weakness.     Gait: Gait normal.  Psychiatric:        Mood and Affect: Mood normal.        Behavior: Behavior normal.        Thought Content: Thought content normal.        Judgment: Judgment normal.     Adult ECG Report Not checked   Recent Labs: Reviewed -> should be due to have his lipids checked by PCP. ->  He did have A1c checked recently and he was told that his A1c is down to 7.2. No results found for: CHOL, HDL, LDLCALC, LDLDIRECT, TRIG, CHOLHDL Lab Results  Component Value Date   CREATININE 0.96 05/07/2020   BUN 13 05/07/2020   NA 140 05/07/2020   K 4.5 05/07/2020   CL 103 05/07/2020   CO2 22 05/07/2020   No flowsheet data found.  No results found for: TSH  ==================================================  COVID-19 Education: The signs and symptoms of COVID-19 were discussed with the patient and how to seek care for testing (follow up with PCP or arrange E-visit).   The importance of social distancing and COVID-19 vaccination was discussed today. The patient is practicing social distancing & Masking.   I spent a total of 71minutes with the  patient spent in direct patient consultation.  Additional time spent with chart review  / charting (studies, outside notes, etc): 10 min Total Time: 27 min  Current medicines are reviewed at length with the patient today.  (+/- concerns) none  This visit occurred during the SARS-CoV-2 public health emergency.  Safety protocols were in place, including screening questions prior to the visit, additional usage of staff PPE, and extensive cleaning of exam room while observing appropriate contact time as indicated for disinfecting solutions.  Notice: This dictation was prepared with Dragon dictation along with smaller phrase technology. Any transcriptional errors that result from this process are unintentional and may not be corrected upon review.  Patient Instructions / Medication Changes & Studies & Tests Ordered   Patient Instructions  Medication Instructions:    *If you need a refill on your cardiac medications before your next appointment, please call your pharmacy*   Lab Work:  If you have labs (blood work) drawn today and your tests are completely normal, you will receive your results only by: Marland Kitchen MyChart Message (if you have MyChart) OR . A paper copy in the mail If you have any lab test that is abnormal or we need to change your treatment, we will call you to review the results.   Testing/Procedures: Not needed   Follow-Up: At [facility], you and your health needs are  our priority.  As part of our continuing mission to provide you with exceptional heart care, we have created designated Provider Care Teams.  These Care Teams include your primary Cardiologist (physician) and Advanced Practice Providers (APPs -  Physician Assistants and Nurse Practitioners) who all work together to provide you with the care you need, when you need it.     Your next appointment:   24 month(s)  The format for your next appointment:   In Person  Provider:   Glenetta Hew, MD    Studies  Ordered:   No orders of the defined types were placed in this encounter.    Glenetta Hew, M.D., M.S. Interventional Cardiologist   Pager # [phone] Phone # [phone] [address]. [place], [place] [address]   Thank you for choosing Heartcare at [facility]!!

## 2020-06-26 NOTE — Patient Instructions (Signed)
Medication Instructions:    *If you need a refill on your cardiac medications before your next appointment, please call your pharmacy*   Lab Work:  If you have labs (blood work) drawn today and your tests are completely normal, you will receive your results only by: Marland Kitchen MyChart Message (if you have MyChart) OR . A paper copy in the mail If you have any lab test that is abnormal or we need to change your treatment, we will call you to review the results.   Testing/Procedures: Not needed   Follow-Up: At [facility], you and your health needs are our priority.  As part of our continuing mission to provide you with exceptional heart care, we have created designated Provider Care Teams.  These Care Teams include your primary Cardiologist (physician) and Advanced Practice Providers (APPs -  Physician Assistants and Nurse Practitioners) who all work together to provide you with the care you need, when you need it.     Your next appointment:   24 month(s)  The format for your next appointment:   In Person  Provider:   Glenetta Hew, MD

## 2020-06-27 DIAGNOSIS — Z1159 Encounter for screening for other viral diseases: Secondary | ICD-10-CM | POA: Diagnosis not present

## 2020-07-03 ENCOUNTER — Other Ambulatory Visit (HOSPITAL_COMMUNITY): Payer: Self-pay | Admitting: Internal Medicine

## 2020-07-03 MED FILL — MOLNUPIRAVIR 200 MG CAPS: 200 | 5 days supply | Qty: 40 | Fill #0

## 2020-07-04 ENCOUNTER — Encounter: Payer: Self-pay | Admitting: Cardiology

## 2020-07-05 ENCOUNTER — Encounter: Payer: Self-pay | Admitting: Cardiology

## 2020-07-05 NOTE — Assessment & Plan Note (Signed)
He is on Zetia.  His goal based on coronary calcium score should be an LDL of 100 or less.  Has not been checked in over a year, but was 101 in October 2020  -Defer to PCP.

## 2020-07-05 NOTE — Assessment & Plan Note (Signed)
He is no longer having chest pain.  Seem to be associated with social stress.  Essentially normal coronary CTA.  Very unlikely that his pain is related to cardiac etiology.

## 2020-07-05 NOTE — Assessment & Plan Note (Signed)
Blood pressures been stable.  Not currently on any medications.  Continue lifestyle modification

## 2020-07-05 NOTE — Assessment & Plan Note (Addendum)
Despite a pretty significant family history premature CAD, he does not have evidence of notable CAD on Coronary CTA

## 2020-09-09 DIAGNOSIS — G4733 Obstructive sleep apnea (adult) (pediatric): Secondary | ICD-10-CM | POA: Diagnosis not present

## 2020-11-14 DIAGNOSIS — N138 Other obstructive and reflux uropathy: Secondary | ICD-10-CM | POA: Diagnosis not present

## 2020-11-14 DIAGNOSIS — N200 Calculus of kidney: Secondary | ICD-10-CM | POA: Diagnosis not present

## 2020-11-14 DIAGNOSIS — N401 Enlarged prostate with lower urinary tract symptoms: Secondary | ICD-10-CM | POA: Diagnosis not present

## 2020-11-14 DIAGNOSIS — N528 Other male erectile dysfunction: Secondary | ICD-10-CM | POA: Diagnosis not present

## 2020-11-14 DIAGNOSIS — N3941 Urge incontinence: Secondary | ICD-10-CM | POA: Diagnosis not present

## 2020-11-14 DIAGNOSIS — M65332 Trigger finger, left middle finger: Secondary | ICD-10-CM | POA: Diagnosis not present

## 2020-11-14 DIAGNOSIS — Z8551 Personal history of malignant neoplasm of bladder: Secondary | ICD-10-CM | POA: Diagnosis not present

## 2020-12-09 DIAGNOSIS — G4733 Obstructive sleep apnea (adult) (pediatric): Secondary | ICD-10-CM | POA: Diagnosis not present

## 2020-12-11 DIAGNOSIS — E538 Deficiency of other specified B group vitamins: Secondary | ICD-10-CM | POA: Diagnosis not present

## 2020-12-11 DIAGNOSIS — F329 Major depressive disorder, single episode, unspecified: Secondary | ICD-10-CM | POA: Diagnosis not present

## 2020-12-11 DIAGNOSIS — G4733 Obstructive sleep apnea (adult) (pediatric): Secondary | ICD-10-CM | POA: Diagnosis not present

## 2020-12-11 DIAGNOSIS — R5383 Other fatigue: Secondary | ICD-10-CM | POA: Diagnosis not present

## 2020-12-11 DIAGNOSIS — J069 Acute upper respiratory infection, unspecified: Secondary | ICD-10-CM | POA: Diagnosis not present

## 2020-12-11 DIAGNOSIS — Z1152 Encounter for screening for COVID-19: Secondary | ICD-10-CM | POA: Diagnosis not present

## 2020-12-11 DIAGNOSIS — J309 Allergic rhinitis, unspecified: Secondary | ICD-10-CM | POA: Diagnosis not present

## 2021-01-03 ENCOUNTER — Encounter: Payer: Self-pay | Admitting: Gastroenterology

## 2021-01-30 DIAGNOSIS — H40003 Preglaucoma, unspecified, bilateral: Secondary | ICD-10-CM | POA: Diagnosis not present

## 2021-01-30 DIAGNOSIS — Z0100 Encounter for examination of eyes and vision without abnormal findings: Secondary | ICD-10-CM | POA: Diagnosis not present

## 2021-03-26 DIAGNOSIS — G4733 Obstructive sleep apnea (adult) (pediatric): Secondary | ICD-10-CM | POA: Diagnosis not present

## 2021-04-28 DIAGNOSIS — E538 Deficiency of other specified B group vitamins: Secondary | ICD-10-CM | POA: Diagnosis not present

## 2021-04-28 DIAGNOSIS — E1165 Type 2 diabetes mellitus with hyperglycemia: Secondary | ICD-10-CM | POA: Diagnosis not present

## 2021-04-28 DIAGNOSIS — Z125 Encounter for screening for malignant neoplasm of prostate: Secondary | ICD-10-CM | POA: Diagnosis not present

## 2021-04-28 DIAGNOSIS — E785 Hyperlipidemia, unspecified: Secondary | ICD-10-CM | POA: Diagnosis not present

## 2021-04-28 DIAGNOSIS — R946 Abnormal results of thyroid function studies: Secondary | ICD-10-CM | POA: Diagnosis not present

## 2021-05-05 DIAGNOSIS — Z0000 Encounter for general adult medical examination without abnormal findings: Secondary | ICD-10-CM | POA: Diagnosis not present

## 2021-05-05 DIAGNOSIS — E1165 Type 2 diabetes mellitus with hyperglycemia: Secondary | ICD-10-CM | POA: Diagnosis not present

## 2021-05-05 DIAGNOSIS — N401 Enlarged prostate with lower urinary tract symptoms: Secondary | ICD-10-CM | POA: Diagnosis not present

## 2021-05-05 DIAGNOSIS — G4733 Obstructive sleep apnea (adult) (pediatric): Secondary | ICD-10-CM | POA: Diagnosis not present

## 2021-05-05 DIAGNOSIS — F4389 Other reactions to severe stress: Secondary | ICD-10-CM | POA: Diagnosis not present

## 2021-05-05 DIAGNOSIS — E785 Hyperlipidemia, unspecified: Secondary | ICD-10-CM | POA: Diagnosis not present

## 2021-05-05 DIAGNOSIS — F329 Major depressive disorder, single episode, unspecified: Secondary | ICD-10-CM | POA: Diagnosis not present

## 2021-05-05 DIAGNOSIS — R82998 Other abnormal findings in urine: Secondary | ICD-10-CM | POA: Diagnosis not present

## 2021-05-05 DIAGNOSIS — E669 Obesity, unspecified: Secondary | ICD-10-CM | POA: Diagnosis not present

## 2021-05-05 DIAGNOSIS — B379 Candidiasis, unspecified: Secondary | ICD-10-CM | POA: Diagnosis not present

## 2021-05-20 DIAGNOSIS — J3489 Other specified disorders of nose and nasal sinuses: Secondary | ICD-10-CM | POA: Diagnosis not present

## 2021-05-20 DIAGNOSIS — J029 Acute pharyngitis, unspecified: Secondary | ICD-10-CM | POA: Diagnosis not present

## 2021-05-20 DIAGNOSIS — R058 Other specified cough: Secondary | ICD-10-CM | POA: Diagnosis not present

## 2021-05-20 DIAGNOSIS — R110 Nausea: Secondary | ICD-10-CM | POA: Diagnosis not present

## 2021-05-20 DIAGNOSIS — R509 Fever, unspecified: Secondary | ICD-10-CM | POA: Diagnosis not present

## 2021-05-20 DIAGNOSIS — J101 Influenza due to other identified influenza virus with other respiratory manifestations: Secondary | ICD-10-CM | POA: Diagnosis not present

## 2021-05-20 DIAGNOSIS — Z1152 Encounter for screening for COVID-19: Secondary | ICD-10-CM | POA: Diagnosis not present

## 2021-05-20 DIAGNOSIS — R519 Headache, unspecified: Secondary | ICD-10-CM | POA: Diagnosis not present

## 2021-06-03 ENCOUNTER — Other Ambulatory Visit: Payer: Self-pay

## 2021-06-03 ENCOUNTER — Ambulatory Visit: Payer: Medicare PPO | Admitting: Adult Health

## 2021-06-03 ENCOUNTER — Encounter: Payer: Self-pay | Admitting: Adult Health

## 2021-06-03 VITALS — BP 133/77 | HR 82 | Ht 71.25 in | Wt 209.4 lb

## 2021-06-03 DIAGNOSIS — G4733 Obstructive sleep apnea (adult) (pediatric): Secondary | ICD-10-CM

## 2021-06-03 DIAGNOSIS — Z9989 Dependence on other enabling machines and devices: Secondary | ICD-10-CM

## 2021-06-03 NOTE — Progress Notes (Signed)
PATIENT: Ruben Logan. DOB: 13-Dec-1950  REASON FOR VISIT: follow up HISTORY FROM: patient  HISTORY OF PRESENT ILLNESS: Today 06/03/21:  Ruben Logan is a 71 year old male with a history of OSA on CPAP. He returns today for follow-up. Reports that he got the Flu at the end of December and was unable to use the CPAP.  Otherwise he reports that is working well for him.  He does not feel the mask leaking as much.  We did adjust the pressure at the last visit however his AHI is still slightly elevated but no significant leak.    06/03/20: Ruben Logan is a 71 year old male with a history of obstructive sleep apnea on CPAP.  His download indicates that he uses machine 28 out of 30 days for compliance of 93%.  He uses machine greater than 4 hours each night.  On average he uses his machine 8 hours and 5 minutes.  His residual AHI is 7.6 on 8 to 12 cm of water.  His leak in the 95th percentile is 45.1 L/min.  His pressure in the 95th percentile is 11.8 with a maximum pressure of 11.9.  He returns today for an evaluation.  11/29/19: Ruben Logan is a 71 year old male with a history of obstructive sleep apnea on CPAP.  His download indicates that he uses machine nightly for compliance of 100%.  He uses machine greater than 4 hours 29 days for compliance of 97%.  On average he uses his machine 7 hours and 38 minutes.  His residual AHI is 6.7 on 8 to 12 cm of water with EPR 1.  Leak in the 95th percentile is 49.8 L/min.  Reports that he tends to lay on the right side and this is when his mask leaks.  HISTORY  (Copied from Dr.Dohmeier's note) Ruben Logan is a 71 y.o. 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 He is a longtime established CPAP patient.  He has had a dramatic history with his son's illness during 2020, who is now at [facility] and has revoked his DNR order.  We repeated a home sleep test on July 15 of last year to see if the patient still requires CPAP and he still  had severe sleep apnea with an AHI of 52.8 RDI of 58.5/h bradycardia and tachycardia and sleep-related hypoxemia for 22.3 minutes.  I ordered an auto titration capable new CPAP machine with a set pressure window between 8 cmH2O and 14 cmH2O and 3 cm EPR.  The study was performed on 7-15 2020 shortly after his birthday.  The patient has remained a very compliant CPAP user as I had expected.  His compliance is 100% for the time up to late October 2020 and for the last 30 days is 90 %.  His AHI became elevated to 7/h from only 4.4/h on 11 cm water set pressure. autoCPAP has been not easy for him.  He reduced the humidifier to low grade.  My suggestion for Ruben Logan is to reduce the AutoSet from 8 through 14-8 through 12 cmH2O the EPR will stay at 1 cmH2O and my goal is to avoid central apneas from too high of a pressure of 4.  Epworth Sleepiness Scale was endorsed at 9 points fatigue severity at 28 points-  The patient is using oxybutynin to control nocturia and urinary flow. He reports a dry mouth.     REVIEW OF SYSTEMS: Out of a complete 14 system  review of symptoms, the patient complains only of the following symptoms, and all other reviewed systems are negative.   ESS 14  ALLERGIES: Allergies  Allergen Reactions   Midazolam Other (See Comments)   Ozempic (0.25 Or 0.5 Mg-Dose) [Semaglutide(0.25 Or 0.5mg -Dos)]     Heartburn abdominal pain as well as diarrhea and irritability   Prozac [Fluoxetine Hcl]     Made him suicidal   Statins Itching    Rosuvastatin, pravastatin, simvastatin: Aches and pains with GI upset    HOME MEDICATIONS: Outpatient Medications Prior to Visit  Medication Sig Dispense Refill   aspirin 81 MG tablet Take 81 mg by mouth daily.     cetirizine (ZYRTEC) 10 MG tablet Take 10 mg by mouth daily.     Dulaglutide (TRULICITY [place]) Inject 1 Dose into the skin once a week.     empagliflozin (JARDIANCE) 25 MG TABS tablet Take 25 mg by mouth daily.     ezetimibe (ZETIA) 10 MG  tablet Take 10 mg by mouth daily.     famotidine (PEPCID) 20 MG tablet Take 20 mg by mouth daily.     fexofenadine (ALLEGRA) 180 MG tablet Take 180 mg by mouth daily. As needed     metFORMIN (GLUMETZA) 1000 MG (MOD) 24 hr tablet Take 1,000 mg by mouth 2 (two) times daily with a meal.      mirabegron ER (MYRBETRIQ) 50 MG TB24 tablet Take 1 tablet by mouth daily.     Molnupiravir 200 MG CAPS TAKE 4 CAPSULES BY MOUTH EVERY 12 HOURS FOR 5 DAYS 40 capsule 0   OVER THE COUNTER MEDICATION 50 mg daily. CBD     oxybutynin (DITROPAN) 5 MG tablet Take 5 mg by mouth.      PARoxetine (PAXIL) 40 MG tablet Take 40 mg by mouth daily.     Respiratory Therapy Supplies (CARETOUCH 2 CPAP HOSE HANGER) MISC Wears it nightly. 11 cm h20 pressure setting     sildenafil (REVATIO) 20 MG tablet Take by mouth.     tamsulosin (FLOMAX) 0.4 MG CAPS capsule Take by mouth.     No facility-administered medications prior to visit.    PAST MEDICAL HISTORY: Past Medical History:  Diagnosis Date   Depression with anxiety    On Paxil   Diabetes mellitus type II, non insulin dependent ([place])    On combination of Jardiance, Metformin, Trulicity   Diverticulosis    Hearing loss    History of bladder cancer 2005   Hx of adenomatous colonic polyps    Hyperlipidemia    On Zetia   Kidney stones    Left-sided chest wall pain 04/2020   Coronary CTA: Coronary Calcium Score of 0.  Low risk.; Normal coronary origin with RCA dominance.; No evidence of CAD. Mild tapering of the LAD at D2  branch.; CAD-RADS 0. No evidence of CAD (0%). Consider non-atherosclerotic causes of chest pain.   OSA on CPAP 12/29/2013   Severe OSA-Dr. Brett Fairy    PAST SURGICAL HISTORY: Past Surgical History:  Procedure Laterality Date   bladder cancer  2005   COLONOSCOPY     Depression     On and off   kidney stones     Presumably lithotripsy   NM MYOVIEW LTD  05/2012   Order for abnormal EKG: [facility]):  Fair exercise capacity.  Hypertensive diastolic  blood pressure.  No EKG changes consistent with ischemia.  Normal LV function.  Normal wall motion.  No ischemia or infarction.--LOW RISK    FAMILY  HISTORY: Family History  Problem Relation Age of Onset   Heart attack Father 20       Initial MI early 4s, died as a result of his third MI)   Coronary artery disease Father 33   Diabetes Mellitus II Mother    Alzheimer's disease Mother    Coronary artery disease Son 67   Heart attack Son 90       Left main CAD-ischemic cardiomyopathy   Cardiomyopathy Son 45       Ischemic-ICD, now status post transplant with ICD.   Alcohol abuse Son    Factor V Leiden deficiency Son    Diabetes Sister    Coronary artery disease Paternal Uncle 83   Heart attack Paternal Uncle 42       Died as a result of CAD-MI in his late 53s   Colon cancer Neg Hx    Esophageal cancer Neg Hx    Stomach cancer Neg Hx    Rectal cancer Neg Hx     SOCIAL HISTORY: Social History   Socioeconomic History   Marital status: Married    Spouse name: Horris Latino   Number of children: 1   Years of education: 14   Highest education level: Not on file  Occupational History   Not on file  Tobacco Use   Smoking status: Former    Packs/day: 1.00    Years: 46.00    Pack years: 46.00    Types: Cigarettes    Quit date: 03/16/2010    Years since quitting: 11.2   Smokeless tobacco: Never  Substance and Sexual Activity   Alcohol use: No   Drug use: No   Sexual activity: Not on file  Other Topics Concern   Not on file  Social History Narrative   Patient is married Theatre stage manager) and lives at home with his wife, and adult son. ->  They have 2 grandchildren ([name], 12-> significant handicap-autism spectrum and Herschel [place], 54)   Patient has one adult child  - Ruben Logan, who lives with them since 2018-> He is s/p Heart Transplant (as a result of Large Anterior STEMI - LM CAD); separated from his family (divorce between 2015-2017), -> during the divorce settlement, the grandchildren stay with  them every other weekend.      --> Ruben Logan has problems with EtOH and polysubstance abuse, Med Non-compliant which is a major issue in a transplant patient, starting to talk about wanting to "give up   --> there has been lots of stress for [place] trying to keep Ruben Logan on track with his medical compliance.  There is concern about possible transplant rejection, thankfully that was not the case.  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   Patient drinks four cups of caffeine daily (coffee, soda and tea).      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   Social Determinants of Health   Financial Resource Strain: Not on file  Food Insecurity: Not on file  Transportation Needs: Not on file  Physical Activity: Not on file  Stress: Not on file  Social Connections: Not on file  Intimate Partner Violence: Not on file      PHYSICAL EXAM  Vitals:   06/03/21 0851  Height: 5' 11.25" (1.81 m)   Body mass index is 29.39 kg/m.  Generalized: Well developed, in no acute distress  Chest: Lungs clear to auscultation bilaterally  Neurological examination  Mentation: Alert oriented to time, place, history taking. Follows all commands speech and language fluent Cranial nerve II-XII: Extraocular movements were full, visual field were full on confrontational test Head turning and shoulder shrug  were normal and symmetric. Motor: The motor testing reveals 5 over 5 strength of all 4 extremities. Good symmetric motor tone is noted throughout.  Sensory: Sensory testing is intact to soft touch on all 4 extremities. No evidence of extinction is noted.  Gait and station: Gait is normal.    DIAGNOSTIC DATA (LABS, IMAGING,  TESTING) - I reviewed patient records, labs, notes, testing and imaging myself where available.     ASSESSMENT AND PLAN 71 y.o. year old male  has a past medical history of Depression with anxiety, Diabetes mellitus type II, non insulin dependent ([place]), Diverticulosis, Hearing loss, History of bladder cancer (2005), adenomatous colonic polyps, Hyperlipidemia, Kidney stones, Left-sided chest wall pain (04/2020), and OSA on CPAP (12/29/2013). here with:  OSA on CPAP  - CPAP compliance suboptimal due to fluid -Residual AHI remains slightly elevated. -Patient is encouraged to change out his supplies regularly - Encourage patient to use CPAP nightly and > 4 hours each night - F/U in 1 year or sooner if needed    Ward Givens, MSN, NP-C 06/03/2021, 8:56 AM [facility] [address], [place], [place] [address] [phone]

## 2021-06-03 NOTE — Patient Instructions (Signed)

## 2021-06-10 DIAGNOSIS — M25551 Pain in right hip: Secondary | ICD-10-CM | POA: Diagnosis not present

## 2021-06-19 DIAGNOSIS — M5451 Vertebrogenic low back pain: Secondary | ICD-10-CM | POA: Diagnosis not present

## 2021-06-19 DIAGNOSIS — M25551 Pain in right hip: Secondary | ICD-10-CM | POA: Diagnosis not present

## 2021-08-19 DIAGNOSIS — R058 Other specified cough: Secondary | ICD-10-CM | POA: Diagnosis not present

## 2021-08-19 DIAGNOSIS — F329 Major depressive disorder, single episode, unspecified: Secondary | ICD-10-CM | POA: Diagnosis not present

## 2021-08-19 DIAGNOSIS — E785 Hyperlipidemia, unspecified: Secondary | ICD-10-CM | POA: Diagnosis not present

## 2021-08-19 DIAGNOSIS — K219 Gastro-esophageal reflux disease without esophagitis: Secondary | ICD-10-CM | POA: Diagnosis not present

## 2021-08-19 DIAGNOSIS — B379 Candidiasis, unspecified: Secondary | ICD-10-CM | POA: Diagnosis not present

## 2021-08-19 DIAGNOSIS — E669 Obesity, unspecified: Secondary | ICD-10-CM | POA: Diagnosis not present

## 2021-08-19 DIAGNOSIS — M791 Myalgia, unspecified site: Secondary | ICD-10-CM | POA: Diagnosis not present

## 2021-08-19 DIAGNOSIS — C679 Malignant neoplasm of bladder, unspecified: Secondary | ICD-10-CM | POA: Diagnosis not present

## 2021-08-19 DIAGNOSIS — E1165 Type 2 diabetes mellitus with hyperglycemia: Secondary | ICD-10-CM | POA: Diagnosis not present

## 2022-01-21 DIAGNOSIS — C679 Malignant neoplasm of bladder, unspecified: Secondary | ICD-10-CM | POA: Diagnosis not present

## 2022-01-21 DIAGNOSIS — E1165 Type 2 diabetes mellitus with hyperglycemia: Secondary | ICD-10-CM | POA: Diagnosis not present

## 2022-01-21 DIAGNOSIS — N401 Enlarged prostate with lower urinary tract symptoms: Secondary | ICD-10-CM | POA: Diagnosis not present

## 2022-01-21 DIAGNOSIS — F329 Major depressive disorder, single episode, unspecified: Secondary | ICD-10-CM | POA: Diagnosis not present

## 2022-01-22 DIAGNOSIS — M65332 Trigger finger, left middle finger: Secondary | ICD-10-CM | POA: Diagnosis not present

## 2022-01-29 DIAGNOSIS — R81 Glycosuria: Secondary | ICD-10-CM | POA: Diagnosis not present

## 2022-01-29 DIAGNOSIS — N138 Other obstructive and reflux uropathy: Secondary | ICD-10-CM | POA: Diagnosis not present

## 2022-01-29 DIAGNOSIS — N401 Enlarged prostate with lower urinary tract symptoms: Secondary | ICD-10-CM | POA: Diagnosis not present

## 2022-01-29 DIAGNOSIS — Z8551 Personal history of malignant neoplasm of bladder: Secondary | ICD-10-CM | POA: Diagnosis not present

## 2022-02-03 DIAGNOSIS — H40003 Preglaucoma, unspecified, bilateral: Secondary | ICD-10-CM | POA: Diagnosis not present

## 2022-02-03 DIAGNOSIS — Z0100 Encounter for examination of eyes and vision without abnormal findings: Secondary | ICD-10-CM | POA: Diagnosis not present

## 2022-05-06 DIAGNOSIS — E1165 Type 2 diabetes mellitus with hyperglycemia: Secondary | ICD-10-CM | POA: Diagnosis not present

## 2022-05-06 DIAGNOSIS — R7989 Other specified abnormal findings of blood chemistry: Secondary | ICD-10-CM | POA: Diagnosis not present

## 2022-05-06 DIAGNOSIS — E785 Hyperlipidemia, unspecified: Secondary | ICD-10-CM | POA: Diagnosis not present

## 2022-05-06 DIAGNOSIS — R946 Abnormal results of thyroid function studies: Secondary | ICD-10-CM | POA: Diagnosis not present

## 2022-05-06 DIAGNOSIS — Z125 Encounter for screening for malignant neoplasm of prostate: Secondary | ICD-10-CM | POA: Diagnosis not present

## 2022-05-06 DIAGNOSIS — Z1211 Encounter for screening for malignant neoplasm of colon: Secondary | ICD-10-CM | POA: Diagnosis not present

## 2022-05-06 DIAGNOSIS — E538 Deficiency of other specified B group vitamins: Secondary | ICD-10-CM | POA: Diagnosis not present

## 2022-05-10 DIAGNOSIS — Z1211 Encounter for screening for malignant neoplasm of colon: Secondary | ICD-10-CM | POA: Diagnosis not present

## 2022-05-10 DIAGNOSIS — E538 Deficiency of other specified B group vitamins: Secondary | ICD-10-CM | POA: Diagnosis not present

## 2022-05-12 DIAGNOSIS — G720 Drug-induced myopathy: Secondary | ICD-10-CM | POA: Diagnosis not present

## 2022-05-12 DIAGNOSIS — H6121 Impacted cerumen, right ear: Secondary | ICD-10-CM | POA: Diagnosis not present

## 2022-05-12 DIAGNOSIS — K219 Gastro-esophageal reflux disease without esophagitis: Secondary | ICD-10-CM | POA: Diagnosis not present

## 2022-05-12 DIAGNOSIS — Z1339 Encounter for screening examination for other mental health and behavioral disorders: Secondary | ICD-10-CM | POA: Diagnosis not present

## 2022-05-12 DIAGNOSIS — D126 Benign neoplasm of colon, unspecified: Secondary | ICD-10-CM | POA: Diagnosis not present

## 2022-05-12 DIAGNOSIS — Z0000 Encounter for general adult medical examination without abnormal findings: Secondary | ICD-10-CM | POA: Diagnosis not present

## 2022-05-12 DIAGNOSIS — J019 Acute sinusitis, unspecified: Secondary | ICD-10-CM | POA: Diagnosis not present

## 2022-05-12 DIAGNOSIS — E1165 Type 2 diabetes mellitus with hyperglycemia: Secondary | ICD-10-CM | POA: Diagnosis not present

## 2022-05-12 DIAGNOSIS — C679 Malignant neoplasm of bladder, unspecified: Secondary | ICD-10-CM | POA: Diagnosis not present

## 2022-05-12 DIAGNOSIS — E785 Hyperlipidemia, unspecified: Secondary | ICD-10-CM | POA: Diagnosis not present

## 2022-05-12 DIAGNOSIS — Z1331 Encounter for screening for depression: Secondary | ICD-10-CM | POA: Diagnosis not present

## 2022-06-03 ENCOUNTER — Ambulatory Visit: Payer: Medicare PPO | Admitting: Adult Health

## 2022-06-03 ENCOUNTER — Encounter: Payer: Self-pay | Admitting: Adult Health

## 2022-06-03 VITALS — BP 149/75 | HR 84 | Ht 71.0 in | Wt 199.8 lb

## 2022-06-03 DIAGNOSIS — G4733 Obstructive sleep apnea (adult) (pediatric): Secondary | ICD-10-CM

## 2022-06-03 NOTE — Progress Notes (Signed)
PATIENT: Ruben Logan. DOB: 1950-09-11  REASON FOR VISIT: follow up HISTORY FROM: patient  HISTORY OF PRESENT ILLNESS: Today 06/03/22:  Ruben Logan. is a 72 y.o. male with a history of OSA on CPAP. Returns today for follow-up.  He reports that the CPAP is working well for him.  He denies any new issues.  DL does show a leak but the patient does not feel one. He states that he has the mask so tight it leaves a mark on his face. Finds it helpful.  His download is below     06/03/2021: Ruben Logan is a 72 year old male with a history of OSA on CPAP. He returns today for follow-up. Reports that he got the Flu at the end of December and was unable to use the CPAP.  Otherwise he reports that is working well for him.  He does not feel the mask leaking as much.  We did adjust the pressure at the last visit however his AHI is still slightly elevated but no significant leak.    06/03/20: Ruben Logan is a 72 year old male with a history of obstructive sleep apnea on CPAP.  His download indicates that he uses machine 28 out of 30 days for compliance of 93%.  He uses machine greater than 4 hours each night.  On average he uses his machine 8 hours and 5 minutes.  His residual AHI is 7.6 on 8 to 12 cm of water.  His leak in the 95th percentile is 45.1 L/min.  His pressure in the 95th percentile is 11.8 with a maximum pressure of 11.9.  He returns today for an evaluation.  11/29/19: Ruben Logan is a 72 year old male with a history of obstructive sleep apnea on CPAP.  His download indicates that he uses machine nightly for compliance of 100%.  He uses machine greater than 4 hours 29 days for compliance of 97%.  On average he uses his machine 7 hours and 38 minutes.  His residual AHI is 6.7 on 8 to 12 cm of water with EPR 1.  Leak in the 95th percentile is 49.8 L/min.  Reports that he tends to lay on the right side and this is when his mask leaks.  HISTORY  (Copied from Dr.Dohmeier's note) Ruben Logan is a 72 y.o. 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 He is a longtime established CPAP patient.  He has had a dramatic history with his son's illness during 2020, who is now at [facility] and has revoked his DNR order.  We repeated a home sleep test on July 15 of last year to see if the patient still requires CPAP and he still had severe sleep apnea with an AHI of 52.8 RDI of 58.5/h bradycardia and tachycardia and sleep-related hypoxemia for 22.3 minutes.  I ordered an auto titration capable new CPAP machine with a set pressure window between 8 cmH2O and 14 cmH2O and 3 cm EPR.  The study was performed on 7-15 2020 shortly after his birthday.  The patient has remained a very compliant CPAP user as I had expected.  His compliance is 100% for the time up to late October 2020 and for the last 30 days is 90 %.  His AHI became elevated to 7/h from only 4.4/h on 11 cm water set pressure. autoCPAP has been not easy for him.  He reduced the humidifier to low grade.  My suggestion for Ruben Logan  is to reduce the AutoSet from 8 through 14-8 through 12 cmH2O the EPR will stay at 1 cmH2O and my goal is to avoid central apneas from too high of a pressure of 4.  Epworth Sleepiness Scale was endorsed at 9 points fatigue severity at 28 points-  The patient is using oxybutynin to control nocturia and urinary flow. He reports a dry mouth.     REVIEW OF SYSTEMS: Out of a complete 14 system review of symptoms, the patient complains only of the following symptoms, and all other reviewed systems are negative.   ESS 14  ALLERGIES: Allergies  Allergen Reactions   Midazolam Other (See Comments)   Ozempic (0.25 Or 0.5 Mg-Dose) [Semaglutide(0.25 Or 0.'5mg'$ -Dos)]     Heartburn abdominal pain as well as diarrhea and irritability   Prozac [Fluoxetine Hcl]     Made him suicidal   Statins Itching    Rosuvastatin, pravastatin, simvastatin: Aches and pains with GI upset    HOME  MEDICATIONS: Outpatient Medications Prior to Visit  Medication Sig Dispense Refill   aspirin 81 MG tablet Take 81 mg by mouth daily.     cetirizine (ZYRTEC) 10 MG tablet Take 10 mg by mouth daily.     Dulaglutide (TRULICITY [place]) Inject 1 Dose into the skin once a week.     empagliflozin (JARDIANCE) 25 MG TABS tablet Take 25 mg by mouth daily.     ezetimibe (ZETIA) 10 MG tablet Take 10 mg by mouth daily.     famotidine (PEPCID) 20 MG tablet Take 20 mg by mouth daily.     fexofenadine (ALLEGRA) 180 MG tablet Take 180 mg by mouth daily. As needed     metFORMIN (GLUMETZA) 1000 MG (MOD) 24 hr tablet Take 1,000 mg by mouth 2 (two) times daily with a meal.      mirabegron ER (MYRBETRIQ) 50 MG TB24 tablet Take 1 tablet by mouth daily.     OVER THE COUNTER MEDICATION 50 mg daily. CBD     oxybutynin (DITROPAN) 5 MG tablet Take 5 mg by mouth.      PARoxetine (PAXIL) 40 MG tablet Take 40 mg by mouth daily.     Respiratory Therapy Supplies (CARETOUCH 2 CPAP HOSE HANGER) MISC Wears it nightly. 11 cm h20 pressure setting     sildenafil (REVATIO) 20 MG tablet Take by mouth.     tamsulosin (FLOMAX) 0.4 MG CAPS capsule Take by mouth.     No facility-administered medications prior to visit.    PAST MEDICAL HISTORY: Past Medical History:  Diagnosis Date   Depression with anxiety    On Paxil   Diabetes mellitus type II, non insulin dependent ([place])    On combination of Jardiance, Metformin, Trulicity   Diverticulosis    Hearing loss    History of bladder cancer 2005   Hx of adenomatous colonic polyps    Hyperlipidemia    On Zetia   Kidney stones    Left-sided chest wall pain 04/2020   Coronary CTA: Coronary Calcium Score of 0.  Low risk.; Normal coronary origin with RCA dominance.; No evidence of CAD. Mild tapering of the LAD at D2  branch.; CAD-RADS 0. No evidence of CAD (0%). Consider non-atherosclerotic causes of chest pain.   OSA on CPAP 12/29/2013   Severe OSA-Dr. Dohmeier    PAST SURGICAL  HISTORY: Past Surgical History:  Procedure Laterality Date   bladder cancer  2005   COLONOSCOPY     Depression     On and  off   kidney stones     Presumably lithotripsy   NM MYOVIEW LTD  05/2012   Order for abnormal EKG: [facility]):  Fair exercise capacity.  Hypertensive diastolic blood pressure.  No EKG changes consistent with ischemia.  Normal LV function.  Normal wall motion.  No ischemia or infarction.--LOW RISK    FAMILY HISTORY: Family History  Problem Relation Age of Onset   Heart attack Father 69       Initial MI early 93s, died as a result of his third MI)   Coronary artery disease Father 59   Diabetes Mellitus II Mother    Alzheimer's disease Mother    Coronary artery disease Son 64   Heart attack Son 54       Left main CAD-ischemic cardiomyopathy   Cardiomyopathy Son 85       Ischemic-ICD, now status post transplant with ICD.   Alcohol abuse Son    Factor V Leiden deficiency Son    Diabetes Sister    Coronary artery disease Paternal Uncle 64   Heart attack Paternal Uncle 45       Died as a result of CAD-MI in his late 85s   Colon cancer Neg Hx    Esophageal cancer Neg Hx    Stomach cancer Neg Hx    Rectal cancer Neg Hx     SOCIAL HISTORY: Social History   Socioeconomic History   Marital status: Married    Spouse name: Horris Latino   Number of children: 1   Years of education: 14   Highest education level: Not on file  Occupational History   Not on file  Tobacco Use   Smoking status: Former    Packs/day: 1.00    Years: 46.00    Total pack years: 46.00    Types: Cigarettes    Quit date: 03/16/2010    Years since quitting: 12.2   Smokeless tobacco: Never  Substance and Sexual Activity   Alcohol use: No   Drug use: No   Sexual activity: Not on file  Other Topics Concern   Not on file  Social History Narrative   Patient is married Horris Latino) and lives at home with his wife, and adult son. ->  They have 2 grandchildren ([name], 12-> significant handicap-autism  spectrum and Herschel [place], 52)   Patient has one adult child  - [name], who lives with them since 2018-> He is s/p Heart Transplant (as a result of Large Anterior STEMI - LM CAD); separated from his family (divorce between 2015-2017), -> during the divorce settlement, the grandchildren stay with them every other weekend.      --> [name] has problems with EtOH and polysubstance abuse, Med Non-compliant which is a major issue in a transplant patient, starting to talk about wanting to "give up   --> there has been lots of stress for [place] trying to keep [name] on track with his medical compliance.  There is concern about possible transplant rejection, thankfully that was not the case.  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   Patient drinks four cups of caffeine daily (coffee, soda and tea).      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   Social Determinants of Health   Financial Resource Strain: Not on file  Food Insecurity: Not on file  Transportation Needs: Not on file  Physical Activity: Not on file  Stress: Not on file  Social Connections: Not on file  Intimate Partner Violence: Not on file      PHYSICAL EXAM  Vitals:   06/03/22 1014  BP: (!) 149/75  Pulse: 84  Weight: 199 lb 12.8 oz (90.6 kg)  Height: '5\' 11"'$  (1.803 m)    Body mass index is 27.87 kg/m.  Generalized: Well developed, in no acute distress  Chest: Lungs clear to auscultation bilaterally  Neurological examination  Mentation: Alert oriented to time, place, history taking. Follows all commands speech and language fluent Cranial nerve II-XII: Extraocular movements were full, visual field were full on confrontational test Head  turning and shoulder shrug  were normal and symmetric. Motor: The motor testing reveals 5 over 5 strength of all 4 extremities. Good symmetric motor tone is noted throughout.  Sensory: Sensory testing is intact to soft touch on all 4 extremities. No evidence of extinction is noted.  Gait and station: Gait is normal.    DIAGNOSTIC DATA (LABS, IMAGING, TESTING) - I reviewed patient records, labs, notes, testing and imaging myself where available.     ASSESSMENT AND PLAN 72 y.o. year old male  has a past medical history of Depression with anxiety, Diabetes mellitus type II, non insulin dependent ([place]), Diverticulosis, Hearing loss, History of bladder cancer (2005), adenomatous colonic polyps, Hyperlipidemia, Kidney stones, Left-sided chest wall pain (04/2020), and OSA on CPAP (12/29/2013). here with:  OSA on CPAP  - CPAP compliance good -Residual AHI remains slightly elevated. - Encourage patient to use CPAP nightly and > 4 hours each night - F/U in 1 year or sooner if needed    Ward Givens, MSN, NP-C 06/03/2022, 10:10 AM [facility] [address] [place], [place] [address] [phone]

## 2022-06-03 NOTE — Patient Instructions (Signed)
Continue using CPAP nightly and greater than 4 hours each night °If your symptoms worsen or you develop new symptoms please let us know.  ° °

## 2022-08-05 DIAGNOSIS — R7989 Other specified abnormal findings of blood chemistry: Secondary | ICD-10-CM | POA: Diagnosis not present

## 2022-08-05 DIAGNOSIS — E1165 Type 2 diabetes mellitus with hyperglycemia: Secondary | ICD-10-CM | POA: Diagnosis not present

## 2022-09-04 ENCOUNTER — Encounter: Payer: Self-pay | Admitting: Nurse Practitioner

## 2022-09-04 ENCOUNTER — Ambulatory Visit: Payer: Medicare PPO | Attending: Nurse Practitioner | Admitting: Nurse Practitioner

## 2022-09-04 VITALS — BP 132/64 | HR 95 | Ht 71.0 in | Wt 203.0 lb

## 2022-09-04 DIAGNOSIS — E785 Hyperlipidemia, unspecified: Secondary | ICD-10-CM

## 2022-09-04 DIAGNOSIS — I10 Essential (primary) hypertension: Secondary | ICD-10-CM | POA: Diagnosis not present

## 2022-09-04 DIAGNOSIS — G4733 Obstructive sleep apnea (adult) (pediatric): Secondary | ICD-10-CM

## 2022-09-04 DIAGNOSIS — E119 Type 2 diabetes mellitus without complications: Secondary | ICD-10-CM

## 2022-09-04 DIAGNOSIS — R0789 Other chest pain: Secondary | ICD-10-CM | POA: Diagnosis not present

## 2022-09-04 NOTE — Patient Instructions (Addendum)
Medication Instructions:   Your physician recommends that you continue on your current medications as directed. Please refer to the Current Medication list given to you today.  *If you need a refill on your cardiac medications before your next appointment, please call your pharmacy*  Lab Work: NONE ordered at this time of appointment   If you have labs (blood work) drawn today and your tests are completely normal, you will receive your results only by: MyChart Message (if you have MyChart) OR A paper copy in the mail If you have any lab test that is abnormal or we need to change your treatment, we will call you to review the results.  Testing/Procedures: NONE ordered at this time of appointment   Follow-Up: At [facility], you and your health needs are our priority.  As part of our continuing mission to provide you with exceptional heart care, we have created designated Provider Care Teams.  These Care Teams include your primary Cardiologist (physician) and Advanced Practice Providers (APPs -  Physician Assistants and Nurse Practitioners) who all work together to provide you with the care you need, when you need it.   Your next appointment:   2 year(s)  Provider:   Bryan Lemma, MD     Other Instructions

## 2022-09-04 NOTE — Progress Notes (Signed)
Office Visit    Patient Name: Ruben Logan. Date of Encounter: 09/04/2022  Primary Care Provider:  [name], MD Primary Cardiologist:  Bryan Lemma, MD  Chief Complaint    72 year old male with a history of hypertension, hyperlipidemia, family history of premature CAD, type 2 diabetes and OSA who presents for follow-up related to hypertension.  Past Medical History    Past Medical History:  Diagnosis Date   Depression with anxiety    On Paxil   Diabetes mellitus type II, non insulin dependent    On combination of Jardiance, Metformin, Trulicity   Diverticulosis    Hearing loss    History of bladder cancer 2005   Hx of adenomatous colonic polyps    Hyperlipidemia    On Zetia   Kidney stones    Left-sided chest wall pain 04/2020   Coronary CTA: Coronary Calcium Score of 0.  Low risk.; Normal coronary origin with RCA dominance.; No evidence of CAD. Mild tapering of the LAD at D2  branch.; CAD-RADS 0. No evidence of CAD (0%). Consider non-atherosclerotic causes of chest pain.   OSA on CPAP 12/29/2013   Severe OSA-Dr. Vickey Huger   Past Surgical History:  Procedure Laterality Date   bladder cancer  2005   COLONOSCOPY     Depression     On and off   kidney stones     Presumably lithotripsy   NM [facility]  05/2012   Order for abnormal EKG: [facility]):  Fair exercise capacity.  Hypertensive diastolic blood pressure.  No EKG changes consistent with ischemia.  Normal LV function.  Normal wall motion.  No ischemia or infarction.--LOW RISK    Allergies  Allergies  Allergen Reactions   Midazolam Other (See Comments)   Ozempic (0.25 Or 0.5 Mg-Dose) [Semaglutide(0.25 Or 0.5mg -Dos)]     Heartburn abdominal pain as well as diarrhea and irritability   Prozac [Fluoxetine Hcl]     Made him suicidal   Statins Itching    Rosuvastatin, pravastatin, simvastatin: Aches and pains with GI upset     Labs/Other Studies Reviewed    The following studies were reviewed  today: CCTA 04/2020:   IMPRESSION: 1. Coronary calcium score of 0.  Low risk.   2. Normal coronary origin with right dominance.   3. No evidence of CAD. Mild tapering of the LAD at second diagonal branch.   4. CAD-RADS 0. No evidence of CAD (0%). Consider non-atherosclerotic causes of chest pain.   Evelena Leyden, MD [facility]  Recent Labs: No results found for requested labs within last 365 days.  Recent Lipid Panel No results found for: "CHOL", "TRIG", "HDL", "CHOLHDL", "VLDL", "LDLCALC", "LDLDIRECT"  History of Present Illness    72 year old male with a history of hypertension, hyperlipidemia, family history of premature CAD, type 2 diabetes, and OSA.   He was initially seen in 2021 in the setting of chest pain, strong family history of premature CAD.  Coronary CT angiogram in 04/2020 showed coronary calcium score of 0, no evidence of CAD.  He was last seen in the office on 06/26/2020 and was stable from a cardiac standpoint.  He denied symptoms concerning for angina.  He presents today for follow-up.  Since his last visit he has been stable from a cardiac standpoint.  He denies any symptoms concerning for angina. He does note some personal stress as he is helping his wife care for her elderly parents.  Overall, he reports feeling well.  Home Medications    Current  Outpatient Medications  Medication Sig Dispense Refill   aspirin 81 MG tablet Take 81 mg by mouth daily.     cetirizine (ZYRTEC) 10 MG tablet Take 10 mg by mouth daily.     Dulaglutide (TRULICITY [place]) Inject 1 Dose into the skin once a week.     empagliflozin (JARDIANCE) 25 MG TABS tablet Take 25 mg by mouth daily.     ezetimibe (ZETIA) 10 MG tablet Take 10 mg by mouth daily.     famotidine (PEPCID) 20 MG tablet Take 20 mg by mouth daily.     fexofenadine (ALLEGRA) 180 MG tablet Take 180 mg by mouth daily. As needed     metFORMIN (GLUMETZA) 1000 MG (MOD) 24 hr tablet Take 1,000 mg by mouth 2 (two) times daily with a meal.       mirabegron ER (MYRBETRIQ) 50 MG TB24 tablet Take 1 tablet by mouth daily.     OVER THE COUNTER MEDICATION 50 mg daily. CBD     oxybutynin (DITROPAN) 5 MG tablet Take 5 mg by mouth.      PARoxetine (PAXIL) 40 MG tablet Take 40 mg by mouth daily.     Respiratory Therapy Supplies (CARETOUCH 2 CPAP HOSE HANGER) MISC Wears it nightly. 11 cm h20 pressure setting     sildenafil (REVATIO) 20 MG tablet Take by mouth.     tamsulosin (FLOMAX) 0.4 MG CAPS capsule Take by mouth.     UNABLE TO FIND Med Name: CBD 50MG  at night     No current facility-administered medications for this visit.     Review of Systems    He denies chest pain, palpitations, dyspnea, pnd, orthopnea, n, v, dizziness, syncope, edema, weight gain, or early satiety. All other systems reviewed and are otherwise negative except as noted above.   Physical Exam    VS:  BP 132/64   Pulse 95   Ht 5\' 11"  (1.803 m)   Wt 203 lb (92.1 kg)   SpO2 96%   BMI 28.31 kg/m   GEN: Well nourished, well developed, in no acute distress. HEENT: normal. Neck: Supple, no JVD, carotid bruits, or masses. Cardiac: RRR, no murmurs, rubs, or gallops. No clubbing, cyanosis, edema.  Radials/DP/PT 2+ and equal bilaterally.  Respiratory:  Respirations regular and unlabored, clear to auscultation bilaterally. GI: Soft, nontender, nondistended, BS + x 4. MS: no deformity or atrophy. Skin: warm and dry, no rash. Neuro:  Strength and sensation are intact. Psych: Normal affect.  Accessory Clinical Findings    ECG personally reviewed by me today -NSR, 95 bpm- no acute changes.   No results found for: "WBC", "HGB", "HCT", "MCV", "PLT" Lab Results  Component Value Date   CREATININE 0.96 05/07/2020   BUN 13 05/07/2020   NA 140 05/07/2020   K 4.5 05/07/2020   CL 103 05/07/2020   CO2 22 05/07/2020   No results found for: "ALT", "AST", "GGT", "ALKPHOS", "BILITOT" No results found for: "CHOL", "HDL", "LDLCALC", "LDLDIRECT", "TRIG", "CHOLHDL"  No  results found for: "HGBA1C"  Assessment & Plan   1. Hypertension: BP well controlled. Continue current antihypertensive regimen.   2. Hyperlipidemia: No recent LDL on file.  Will request most recent labs from PCP.  Continue Zetia.  3. Atypical chest pain: Strong family history of premature CAD.  Coronary CT angiogram in 04/2020 showed coronary calcium score of 0, no evidence of CAD. Stable with no anginal symptoms. No indication for ischemic evaluation.   4. OSA: Adherent to CPAP.   5.  Type 2 diabetes: A1c was 7.4 in 04/2022.  Monitor managed per PCP.  6. Disposition: Follow-up in 2 years, sooner if needed.     Joylene Grapes, NP 09/04/2022, 1:41 PM

## 2022-09-12 IMAGING — CT CT HEART MORP W/ CTA COR W/ SCORE W/ CA W/CM &/OR W/O CM
1 series · 2 of 2 positions shown, 3 images · non-contrast
Comparison: None.
COMPARISON: None.

Addendum:
EXAM:
OVER-READ INTERPRETATION  CT CHEST

The following report is an over-read performed by radiologist Dr.
over-read does not include interpretation of cardiac or coronary
anatomy or pathology. The coronary CTA interpretation by the
cardiologist is attached.
CLINICAL DATA: 69 year old with chest pain
Cardiac/Coronary ACTA
TECHNIQUE: The patient was scanned on a Phillips Force scanner.

[Series 267: — · 0.20mm/px · 2 of 2 slices shown, 3 images]
[im 1/2  vessel]
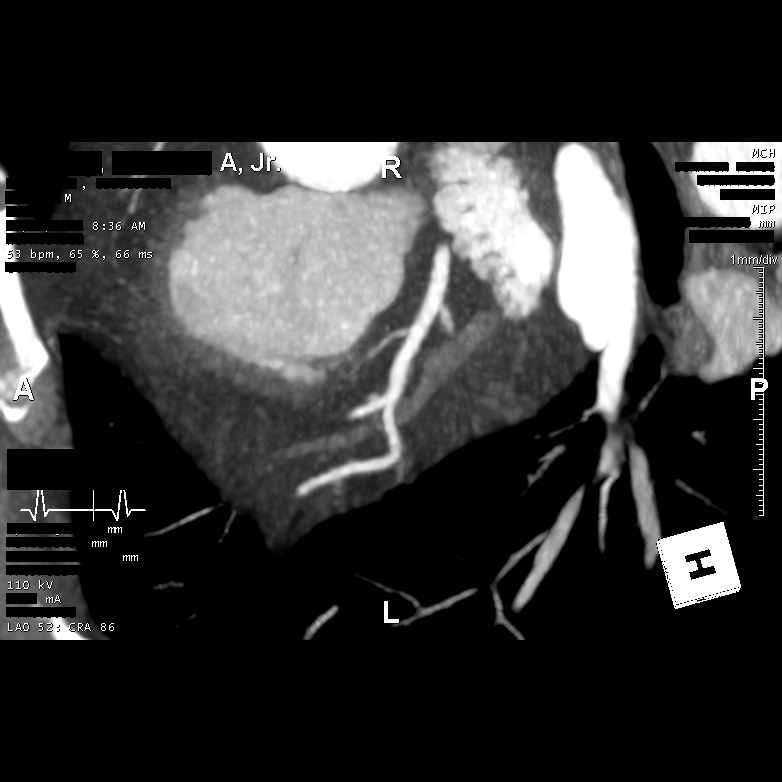
[im 1/2  lung]
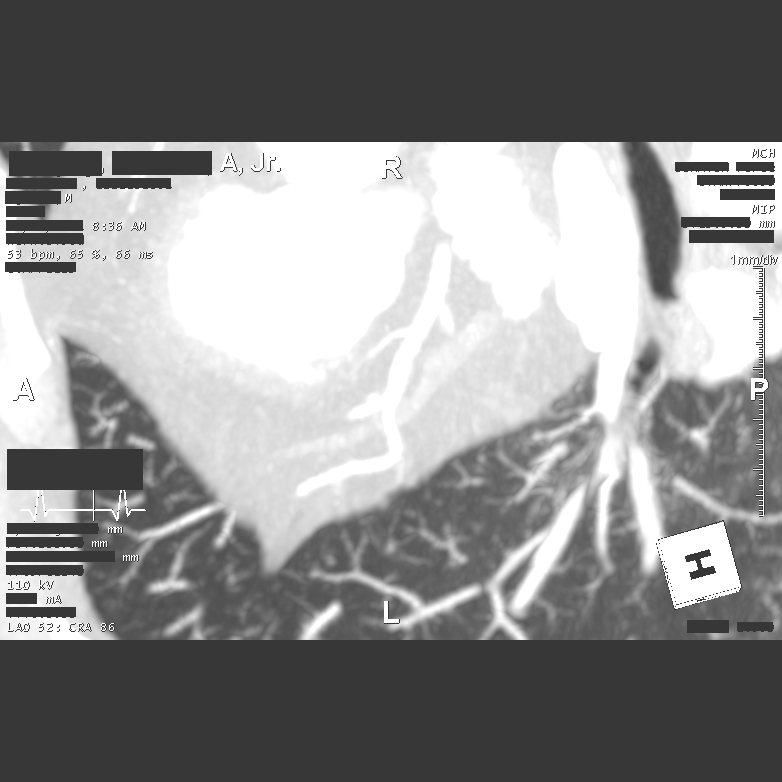
[im 2/2  vessel]
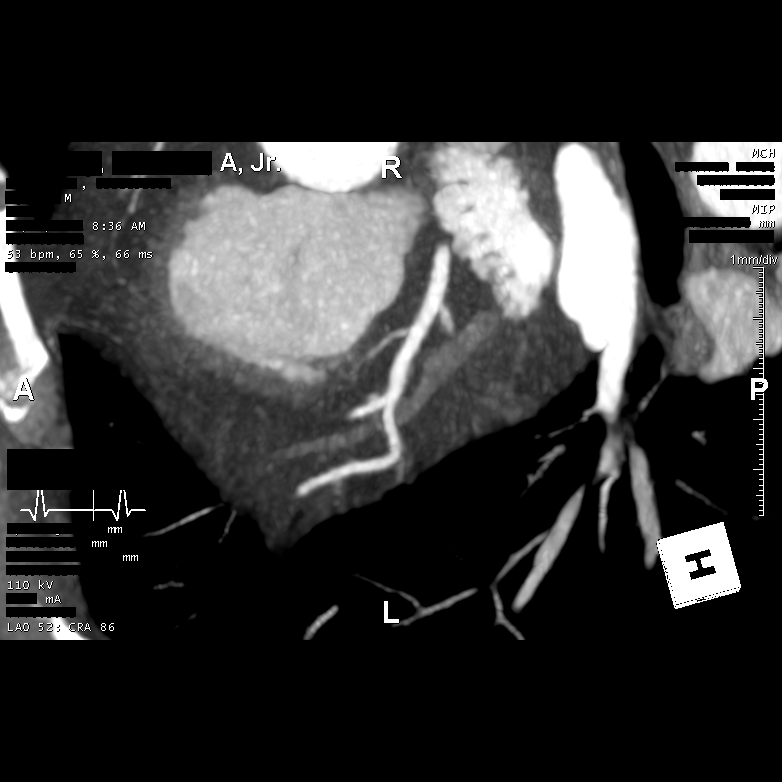

[2 of 2 positions shown; findings below may reference images not displayed]

FINDINGS: Vascular: Normal aortic caliber. No central pulmonary embolism, on
this non-dedicated study.

Mediastinum/Nodes: No imaged thoracic adenopathy.

Lungs/Pleura: No pleural fluid.  Clear imaged lungs.

Upper Abdomen: Normal imaged portions of the liver, stomach.

Musculoskeletal: Midthoracic spondylosis.
IMPRESSION: No acute findings in the imaged extracardiac chest.
FINDINGS: A 110 kV prospective scan was triggered in the descending thoracic
aorta at 111 HU's. Axial non-contrast 3 mm slices were carried out
through the heart. The data set was analyzed on a dedicated work
station and scored using the Agates method. Gantry rotation speed
was 250 msec's and collimation was .6 mm. Beta blockade and 0.8 mg
of sl NTG was given. The 3 D data set was reconstructed in 5%
intervals of the 67-82 % of the R-R cycle. Diastolic phases were
analyzed on a dedicated work station using NPR, MIP and VT modes.
The patient received 80 cc of contrast.

Aorta:  Normal size.  No calcifications.  No dissection.

Aortic Valve: Bileaflet.  No calcifications.

Coronary Arteries:  Normal coronary origin.  Right dominance.

RCA is a large dominant artery that gives rise to PDA and PLAX.
There is no plaque.

Left main is a large artery that gives rise to LAD and GUALDEMAR arteries.

LAD is a large vessel that has no plaque. Small caliber high
diagonal/ ramus. Second diagonal moderate sized with no plaque. LAD
tapers slightly at the second diagonal branch.

GUALDEMAR is a non-dominant artery that gives rise to one large OM 1
branch. There is no plaque.

Other findings:

Normal pulmonary vein drainage into the left atrium.

Normal left atrial appendage without a thrombus.

Normal size of the pulmonary artery.

Please see radiology report for non cardiac findings.
IMPRESSION: 1. Coronary calcium score of 0.  Low risk.

2. Normal coronary origin with right dominance.

3. No evidence of CAD. Mild tapering of the LAD at second diagonal
branch.

4. CAD-RADS 0. No evidence of CAD (0%). Consider non-atherosclerotic
causes of chest pain.

*** End of Addendum ***
EXAM:
OVER-READ INTERPRETATION  CT CHEST

The following report is an over-read performed by radiologist Dr.
over-read does not include interpretation of cardiac or coronary
anatomy or pathology. The coronary CTA interpretation by the
cardiologist is attached.
FINDINGS: Vascular: Normal aortic caliber. No central pulmonary embolism, on
this non-dedicated study.

Mediastinum/Nodes: No imaged thoracic adenopathy.

Lungs/Pleura: No pleural fluid.  Clear imaged lungs.

Upper Abdomen: Normal imaged portions of the liver, stomach.

Musculoskeletal: Midthoracic spondylosis.
IMPRESSION: No acute findings in the imaged extracardiac chest.

## 2022-10-09 DIAGNOSIS — G4733 Obstructive sleep apnea (adult) (pediatric): Secondary | ICD-10-CM | POA: Diagnosis not present

## 2022-10-26 ENCOUNTER — Encounter: Payer: Self-pay | Admitting: Gastroenterology

## 2022-11-17 DIAGNOSIS — R399 Unspecified symptoms and signs involving the genitourinary system: Secondary | ICD-10-CM | POA: Diagnosis not present

## 2023-02-04 DIAGNOSIS — N3941 Urge incontinence: Secondary | ICD-10-CM | POA: Diagnosis not present

## 2023-02-04 DIAGNOSIS — Z08 Encounter for follow-up examination after completed treatment for malignant neoplasm: Secondary | ICD-10-CM | POA: Diagnosis not present

## 2023-02-04 DIAGNOSIS — Z8551 Personal history of malignant neoplasm of bladder: Secondary | ICD-10-CM | POA: Diagnosis not present

## 2023-02-04 DIAGNOSIS — N529 Male erectile dysfunction, unspecified: Secondary | ICD-10-CM | POA: Diagnosis not present

## 2023-02-09 DIAGNOSIS — B372 Candidiasis of skin and nail: Secondary | ICD-10-CM | POA: Diagnosis not present

## 2023-02-09 DIAGNOSIS — E119 Type 2 diabetes mellitus without complications: Secondary | ICD-10-CM | POA: Diagnosis not present

## 2023-02-09 DIAGNOSIS — N401 Enlarged prostate with lower urinary tract symptoms: Secondary | ICD-10-CM | POA: Diagnosis not present

## 2023-02-09 DIAGNOSIS — G720 Drug-induced myopathy: Secondary | ICD-10-CM | POA: Diagnosis not present

## 2023-02-09 DIAGNOSIS — Z79899 Other long term (current) drug therapy: Secondary | ICD-10-CM | POA: Diagnosis not present

## 2023-02-09 DIAGNOSIS — E1165 Type 2 diabetes mellitus with hyperglycemia: Secondary | ICD-10-CM | POA: Diagnosis not present

## 2023-02-09 DIAGNOSIS — H43811 Vitreous degeneration, right eye: Secondary | ICD-10-CM | POA: Diagnosis not present

## 2023-02-09 DIAGNOSIS — C679 Malignant neoplasm of bladder, unspecified: Secondary | ICD-10-CM | POA: Diagnosis not present

## 2023-02-09 DIAGNOSIS — H02055 Trichiasis without entropian left lower eyelid: Secondary | ICD-10-CM | POA: Diagnosis not present

## 2023-02-09 DIAGNOSIS — Z0100 Encounter for examination of eyes and vision without abnormal findings: Secondary | ICD-10-CM | POA: Diagnosis not present

## 2023-02-09 DIAGNOSIS — E785 Hyperlipidemia, unspecified: Secondary | ICD-10-CM | POA: Diagnosis not present

## 2023-02-09 DIAGNOSIS — H2513 Age-related nuclear cataract, bilateral: Secondary | ICD-10-CM | POA: Diagnosis not present

## 2023-02-09 DIAGNOSIS — F329 Major depressive disorder, single episode, unspecified: Secondary | ICD-10-CM | POA: Diagnosis not present

## 2023-02-11 DIAGNOSIS — N3941 Urge incontinence: Secondary | ICD-10-CM | POA: Diagnosis not present

## 2023-02-24 DIAGNOSIS — N3941 Urge incontinence: Secondary | ICD-10-CM | POA: Diagnosis not present

## 2023-04-06 DIAGNOSIS — G4733 Obstructive sleep apnea (adult) (pediatric): Secondary | ICD-10-CM | POA: Diagnosis not present

## 2023-06-07 ENCOUNTER — Telehealth: Payer: Self-pay | Admitting: Adult Health

## 2023-06-07 NOTE — Telephone Encounter (Signed)
 Pt called to cancel appointment due to caring for mother in law.  While speaking call ended, phone rep tried calling pt back, unable to reach him, no option to leave a vm.

## 2023-06-09 ENCOUNTER — Ambulatory Visit: Payer: Medicare PPO | Admitting: Adult Health

## 2023-07-10 DIAGNOSIS — G4733 Obstructive sleep apnea (adult) (pediatric): Secondary | ICD-10-CM | POA: Diagnosis not present

## 2023-07-23 DIAGNOSIS — E538 Deficiency of other specified B group vitamins: Secondary | ICD-10-CM | POA: Diagnosis not present

## 2023-07-23 DIAGNOSIS — R030 Elevated blood-pressure reading, without diagnosis of hypertension: Secondary | ICD-10-CM | POA: Diagnosis not present

## 2023-07-23 DIAGNOSIS — Z1212 Encounter for screening for malignant neoplasm of rectum: Secondary | ICD-10-CM | POA: Diagnosis not present

## 2023-07-23 DIAGNOSIS — R946 Abnormal results of thyroid function studies: Secondary | ICD-10-CM | POA: Diagnosis not present

## 2023-07-23 DIAGNOSIS — E785 Hyperlipidemia, unspecified: Secondary | ICD-10-CM | POA: Diagnosis not present

## 2023-07-23 DIAGNOSIS — E1165 Type 2 diabetes mellitus with hyperglycemia: Secondary | ICD-10-CM | POA: Diagnosis not present

## 2023-07-23 DIAGNOSIS — N401 Enlarged prostate with lower urinary tract symptoms: Secondary | ICD-10-CM | POA: Diagnosis not present

## 2023-07-30 DIAGNOSIS — R82998 Other abnormal findings in urine: Secondary | ICD-10-CM | POA: Diagnosis not present

## 2023-07-30 DIAGNOSIS — F329 Major depressive disorder, single episode, unspecified: Secondary | ICD-10-CM | POA: Diagnosis not present

## 2023-07-30 DIAGNOSIS — C679 Malignant neoplasm of bladder, unspecified: Secondary | ICD-10-CM | POA: Diagnosis not present

## 2023-07-30 DIAGNOSIS — E785 Hyperlipidemia, unspecified: Secondary | ICD-10-CM | POA: Diagnosis not present

## 2023-07-30 DIAGNOSIS — E1165 Type 2 diabetes mellitus with hyperglycemia: Secondary | ICD-10-CM | POA: Diagnosis not present

## 2023-07-30 DIAGNOSIS — M25519 Pain in unspecified shoulder: Secondary | ICD-10-CM | POA: Diagnosis not present

## 2023-07-30 DIAGNOSIS — E669 Obesity, unspecified: Secondary | ICD-10-CM | POA: Diagnosis not present

## 2023-07-30 DIAGNOSIS — Z0000 Encounter for general adult medical examination without abnormal findings: Secondary | ICD-10-CM | POA: Diagnosis not present

## 2023-07-30 DIAGNOSIS — G720 Drug-induced myopathy: Secondary | ICD-10-CM | POA: Diagnosis not present

## 2023-08-10 DIAGNOSIS — H40053 Ocular hypertension, bilateral: Secondary | ICD-10-CM | POA: Diagnosis not present

## 2023-08-10 DIAGNOSIS — Z0100 Encounter for examination of eyes and vision without abnormal findings: Secondary | ICD-10-CM | POA: Diagnosis not present

## 2023-08-10 DIAGNOSIS — H2513 Age-related nuclear cataract, bilateral: Secondary | ICD-10-CM | POA: Diagnosis not present

## 2023-08-10 DIAGNOSIS — E119 Type 2 diabetes mellitus without complications: Secondary | ICD-10-CM | POA: Diagnosis not present

## 2023-08-10 DIAGNOSIS — H04123 Dry eye syndrome of bilateral lacrimal glands: Secondary | ICD-10-CM | POA: Diagnosis not present

## 2023-08-18 DIAGNOSIS — H2512 Age-related nuclear cataract, left eye: Secondary | ICD-10-CM | POA: Diagnosis not present

## 2023-08-19 ENCOUNTER — Encounter: Payer: Self-pay | Admitting: Ophthalmology

## 2023-08-19 NOTE — Anesthesia Preprocedure Evaluation (Addendum)
 Anesthesia Evaluation  Patient identified by MRN, date of birth, ID band Patient awake    Reviewed: Allergy & Precautions, H&P , NPO status , Patient's Chart, lab work & pertinent test results  Airway Mallampati: III  TM Distance: >3 FB Neck ROM: Full    Dental no notable dental hx.    Pulmonary neg pulmonary ROS, sleep apnea , former smoker   Pulmonary exam normal breath sounds clear to auscultation       Cardiovascular hypertension, negative cardio ROS Normal cardiovascular exam Rhythm:Regular Rate:Normal     Neuro/Psych  PSYCHIATRIC DISORDERS Anxiety Depression    negative neurological ROS  negative psych ROS   GI/Hepatic negative GI ROS, Neg liver ROS,GERD  ,,  Endo/Other  negative endocrine ROSdiabetes    Renal/GU Renal diseasenegative Renal ROS  negative genitourinary   Musculoskeletal negative musculoskeletal ROS (+) Arthritis ,    Abdominal   Peds negative pediatric ROS (+)  Hematology negative hematology ROS (+)   Anesthesia Other Findings Kidney stones  Depression with anxiety OSA on CPAP  Hyperlipidemia History of bladder cancer  Hx of adenomatous colonic polyps Diverticulosis  Hearing loss Diabetes mellitus type II, non insulin dependent (HCC)  Left-sided chest wall pain Arthritis  GERD (gastroesophageal reflux disease    Reproductive/Obstetrics negative OB ROS                             Anesthesia Physical Anesthesia Plan  ASA: 3  Anesthesia Plan: MAC   Post-op Pain Management:    Induction: Intravenous  PONV Risk Score and Plan:   Airway Management Planned: Natural Airway and Nasal Cannula  Additional Equipment:   Intra-op Plan:   Post-operative Plan:   Informed Consent: I have reviewed the patients History and Physical, chart, labs and discussed the procedure including the risks, benefits and alternatives for the proposed anesthesia with the  patient or authorized representative who has indicated his/her understanding and acceptance.     Dental Advisory Given  Plan Discussed with: Anesthesiologist, CRNA and Surgeon  Anesthesia Plan Comments: (Patient consented for risks of anesthesia including but not limited to:  - adverse reactions to medications - damage to eyes, teeth, lips or other oral mucosa - nerve damage due to positioning  - sore throat or hoarseness - Damage to heart, brain, nerves, lungs, other parts of body or loss of life  Patient voiced understanding and assent.)       Anesthesia Quick Evaluation

## 2023-08-24 NOTE — Discharge Instructions (Signed)

## 2023-08-25 ENCOUNTER — Ambulatory Visit: Payer: Self-pay | Admitting: Anesthesiology

## 2023-08-25 ENCOUNTER — Ambulatory Visit
Admission: RE | Admit: 2023-08-25 | Discharge: 2023-08-25 | Disposition: A | Discharge status: Home / Self Care | Attending: Ophthalmology | Admitting: Ophthalmology

## 2023-08-25 ENCOUNTER — Encounter: Payer: Self-pay | Admitting: Ophthalmology

## 2023-08-25 ENCOUNTER — Other Ambulatory Visit: Payer: Self-pay

## 2023-08-25 ENCOUNTER — Encounter
Admission: RE | Disposition: A | Discharge status: Home / Self Care | Payer: Self-pay | Source: Home / Self Care | Attending: Ophthalmology

## 2023-08-25 DIAGNOSIS — H2512 Age-related nuclear cataract, left eye: Secondary | ICD-10-CM | POA: Diagnosis not present

## 2023-08-25 DIAGNOSIS — H5212 Myopia, left eye: Secondary | ICD-10-CM | POA: Diagnosis not present

## 2023-08-25 DIAGNOSIS — G4730 Sleep apnea, unspecified: Secondary | ICD-10-CM | POA: Insufficient documentation

## 2023-08-25 DIAGNOSIS — E1136 Type 2 diabetes mellitus with diabetic cataract: Secondary | ICD-10-CM | POA: Diagnosis not present

## 2023-08-25 DIAGNOSIS — I10 Essential (primary) hypertension: Secondary | ICD-10-CM | POA: Diagnosis not present

## 2023-08-25 DIAGNOSIS — Z87891 Personal history of nicotine dependence: Secondary | ICD-10-CM | POA: Insufficient documentation

## 2023-08-25 DIAGNOSIS — K219 Gastro-esophageal reflux disease without esophagitis: Secondary | ICD-10-CM | POA: Diagnosis not present

## 2023-08-25 DIAGNOSIS — I25811 Atherosclerosis of native coronary artery of transplanted heart without angina pectoris: Secondary | ICD-10-CM | POA: Insufficient documentation

## 2023-08-25 DIAGNOSIS — Z833 Family history of diabetes mellitus: Secondary | ICD-10-CM | POA: Diagnosis not present

## 2023-08-25 DIAGNOSIS — Z7982 Long term (current) use of aspirin: Secondary | ICD-10-CM | POA: Diagnosis not present

## 2023-08-25 DIAGNOSIS — Z7985 Long-term (current) use of injectable non-insulin antidiabetic drugs: Secondary | ICD-10-CM | POA: Insufficient documentation

## 2023-08-25 DIAGNOSIS — G4733 Obstructive sleep apnea (adult) (pediatric): Secondary | ICD-10-CM | POA: Diagnosis not present

## 2023-08-25 DIAGNOSIS — F419 Anxiety disorder, unspecified: Secondary | ICD-10-CM | POA: Insufficient documentation

## 2023-08-25 DIAGNOSIS — M1990 Unspecified osteoarthritis, unspecified site: Secondary | ICD-10-CM | POA: Diagnosis not present

## 2023-08-25 DIAGNOSIS — F32A Depression, unspecified: Secondary | ICD-10-CM | POA: Diagnosis not present

## 2023-08-25 DIAGNOSIS — Z7984 Long term (current) use of oral hypoglycemic drugs: Secondary | ICD-10-CM | POA: Insufficient documentation

## 2023-08-25 HISTORY — DX: Gastro-esophageal reflux disease without esophagitis: K21.9

## 2023-08-25 HISTORY — DX: Unspecified osteoarthritis, unspecified site: M19.90

## 2023-08-25 HISTORY — PX: CATARACT EXTRACTION W/PHACO: SHX586

## 2023-08-25 LAB — GLUCOSE, CAPILLARY: Glucose-Capillary: 163 mg/dL — ABNORMAL HIGH (ref 70–99)

## 2023-08-25 SURGERY — PHACOEMULSIFICATION, CATARACT, WITH IOL INSERTION
Anesthesia: Monitor Anesthesia Care | Site: Eye | Laterality: Left

## 2023-08-25 MED ORDER — NEOMYCIN-POLYMYXIN-DEXAMETH 3.5-10000-0.1 OP OINT
TOPICAL_OINTMENT | OPHTHALMIC | Status: DC | PRN
  Administered 2023-08-25: 1 via OPHTHALMIC

## 2023-08-25 MED ORDER — TETRACAINE HCL 0.5 % OP SOLN
OPHTHALMIC | Status: AC
  Filled 2023-08-25: qty 4

## 2023-08-25 MED ORDER — SODIUM CHLORIDE 0.9% FLUSH
INTRAVENOUS | Status: DC | PRN
  Administered 2023-08-25 (×2): 10 mL via INTRAVENOUS

## 2023-08-25 MED ORDER — FENTANYL CITRATE (PF) 100 MCG/2ML IJ SOLN
INTRAMUSCULAR | Status: DC | PRN
Start: 2023-08-25 — End: 2023-08-25
  Administered 2023-08-25 (×2): 50 ug via INTRAVENOUS

## 2023-08-25 MED ORDER — ARMC OPHTHALMIC DILATING DROPS
1.0000 | OPHTHALMIC | Status: DC | PRN
  Administered 2023-08-25 (×3): 1 via OPHTHALMIC

## 2023-08-25 MED ORDER — TETRACAINE HCL 0.5 % OP SOLN
1.0000 [drp] | OPHTHALMIC | Status: DC | PRN
  Administered 2023-08-25 (×3): 1 [drp] via OPHTHALMIC

## 2023-08-25 MED ORDER — FENTANYL CITRATE (PF) 100 MCG/2ML IJ SOLN
INTRAMUSCULAR | Status: AC
Start: 2023-08-25 — End: ?
  Filled 2023-08-25: qty 2

## 2023-08-25 MED ORDER — CEFUROXIME OPHTHALMIC INJECTION 1 MG/0.1 ML
INJECTION | OPHTHALMIC | Status: DC | PRN
  Administered 2023-08-25: 1 mg via INTRACAMERAL

## 2023-08-25 MED ORDER — DEXMEDETOMIDINE HCL IN NACL 200 MCG/50ML IV SOLN
INTRAVENOUS | Status: DC | PRN
  Administered 2023-08-25 (×2): 8 ug via INTRAVENOUS

## 2023-08-25 MED ORDER — BRIMONIDINE TARTRATE-TIMOLOL 0.2-0.5 % OP SOLN
OPHTHALMIC | Status: DC | PRN
Start: 2023-08-25 — End: 2023-08-25
  Administered 2023-08-25: 1 [drp] via OPHTHALMIC

## 2023-08-25 MED ORDER — SIGHTPATH DOSE#1 NA HYALUR & NA CHOND-NA HYALUR IO KIT
PACK | INTRAOCULAR | Status: DC | PRN
  Administered 2023-08-25: 1 via OPHTHALMIC

## 2023-08-25 MED ORDER — DEXMEDETOMIDINE HCL IN NACL 80 MCG/20ML IV SOLN
INTRAVENOUS | Status: AC
  Filled 2023-08-25: qty 20

## 2023-08-25 MED ORDER — LIDOCAINE HCL (PF) 2 % IJ SOLN
INTRAOCULAR | Status: DC | PRN
  Administered 2023-08-25: 2 mL

## 2023-08-25 MED ORDER — SIGHTPATH DOSE#1 BSS IO SOLN
INTRAOCULAR | Status: DC | PRN
  Administered 2023-08-25: 60 mL via OPHTHALMIC

## 2023-08-25 MED ORDER — ARMC OPHTHALMIC DILATING DROPS
OPHTHALMIC | Status: AC
  Filled 2023-08-25: qty 0.5

## 2023-08-25 MED ORDER — SIGHTPATH DOSE#1 BSS IO SOLN
INTRAOCULAR | Status: DC | PRN
  Administered 2023-08-25: 15 mL via INTRAOCULAR

## 2023-08-25 SURGICAL SUPPLY — 12 items
CATARACT SUITE SIGHTPATH (MISCELLANEOUS) ×1 IMPLANT
FEE CATARACT SUITE SIGHTPATH (MISCELLANEOUS) ×1 IMPLANT
GLOVE BIOGEL PI IND STRL 8 (GLOVE) ×1 IMPLANT
GLOVE SURG LX STRL 7.5 STRW (GLOVE) ×1 IMPLANT
GLOVE SURG PROTEXIS BL SZ6.5 (GLOVE) ×1 IMPLANT
GLOVE SURG SYN 6.5 PF PI BL (GLOVE) ×1 IMPLANT
LEN CLAREON VIVITY TORIC 19.5 ×1 IMPLANT
LENS IOL CLRN VT TRC 6 19.5 IMPLANT
NDL FILTER BLUNT 18X1 1/2 (NEEDLE) ×1 IMPLANT
NEEDLE FILTER BLUNT 18X1 1/2 (NEEDLE) ×1 IMPLANT
RING MALYGIN 7.0 (MISCELLANEOUS) IMPLANT
SYR 3ML LL SCALE MARK (SYRINGE) ×1 IMPLANT

## 2023-08-25 NOTE — Anesthesia Postprocedure Evaluation (Signed)
 Anesthesia Post Note  Patient: Hymie Gorr.  Procedure(s) Performed: PHACOEMULSIFICATION, CATARACT, WITH IOL INSERTION 7.70 00:34.1 (Left: Eye)  Patient location during evaluation: PACU Anesthesia Type: MAC Level of consciousness: awake and alert Pain management: pain level controlled Vital Signs Assessment: post-procedure vital signs reviewed and stable Respiratory status: spontaneous breathing, nonlabored ventilation, respiratory function stable and patient connected to nasal cannula oxygen Cardiovascular status: stable and blood pressure returned to baseline Postop Assessment: no apparent nausea or vomiting Anesthetic complications: no   No notable events documented.   Last Vitals:  Vitals:   08/25/23 1026 08/25/23 1031  BP: (!) 121/103 (!) 127/59  Pulse: 64 63  Resp: 14 14  Temp: (!) 36.3 C (!) 36.3 C  SpO2: 96% 96%    Last Pain:  Vitals:   08/25/23 1031  TempSrc:   PainSc: 0-No pain                 Dahiana Kulak C Roshard Rezabek

## 2023-08-25 NOTE — Transfer of Care (Signed)
 Immediate Anesthesia Transfer of Care Note  Patient: Ruben Logan.  Procedure(s) Performed: PHACOEMULSIFICATION, CATARACT, WITH IOL INSERTION 7.70 00:34.1 (Left: Eye)  Patient Location: PACU  Anesthesia Type:MAC  Level of Consciousness: awake, alert , and oriented  Airway & Oxygen Therapy: Patient Spontanous Breathing  Post-op Assessment: Report given to RN and Post -op Vital signs reviewed and stable  Post vital signs: Reviewed and stable  Last Vitals:  Vitals Value Taken Time  BP 121/103 08/25/23 1026  Temp 36.3 C 08/25/23 1026  Pulse 67 08/25/23 1028  Resp 15 08/25/23 1028  SpO2 93 % 08/25/23 1028  Vitals shown include unfiled device data.  Last Pain:  Vitals:   08/25/23 1026  TempSrc:   PainSc: 0-No pain         Complications: No notable events documented.

## 2023-08-25 NOTE — Op Note (Signed)
 LOCATION:  [facility]   PREOPERATIVE DIAGNOSIS:  Nuclear sclerotic cataract of the left eye.  H25.12  POSTOPERATIVE DIAGNOSIS:  Nuclear sclerotic cataract of the left eye with miotic pupil   PROCEDURE:  Phacoemulsification with Toric posterior chamber intraocular lens placement of the left eye with Malyugin ring placemnet  Ultrasound time: Procedure(s): PHACOEMULSIFICATION, CATARACT, WITH IOL INSERTION 7.70 00:34.1 (Left)  LENS:   Implant Name Type Inv. Item Serial No. Manufacturer Lot No. LRB No. Used Action  LEN CLAREON VIVITY TORIC 19.5 - [id number]  LEN CLAREON VIVITY TORIC 19.5 [id number] [employer]  Left 1 Implanted     [id number] Vivity Toric intraocular lens with 3.0 diopters of cylindrical power with axis orientation at 178 degrees.     SURGEON:  Deirdre Evener, MD   ANESTHESIA:  Topical with tetracaine drops and 2% Xylocaine jelly, augmented with 1% preservative-free intracameral lidocaine.  COMPLICATIONS:  None.   DESCRIPTION OF PROCEDURE:  The patient was identified in the holding room and transported to the operating suite and placed in the supine position under the operating microscope.  The left eye was identified as the operative eye, and it was prepped and draped in the usual sterile ophthalmic fashion.    A clear-corneal paracentesis incision was made at the 1:30 position.  0.5 ml of preservative-free 1% lidocaine was injected into the anterior chamber. The anterior chamber was filled with Viscoat.  A 2.4 millimeter near clear corneal incision was then made at the 10:30 position. A Malyugin ring was placed to expand the pupil to 7mm.  A cystotome and capsulorrhexis forceps were then used to make a curvilinear capsulorrhexis.  Hydrodissection and hydrodelineation were then performed using balanced salt solution.   Phacoemulsification was then used in stop and chop fashion to remove the lens, nucleus and epinucleus.  The remaining cortex was aspirated using  the irrigation and aspiration handpiece.  Provisc viscoelastic was then placed into the capsular bag to distend it for lens placement.  The Verion digital marker was used to align the implant at the intended axis.   A Toric lens was then injected into the capsular bag.  It was rotated clockwise until the axis marks on the lens were approximately 15 degrees in the counterclockwise direction to the intended alignment. The Malyugin ring was removed. The viscoelastic was aspirated from the eye using the irrigation aspiration handpiece.  Then, a Koch spatula through the sideport incision was used to rotate the lens in a clockwise direction until the axis markings of the intraocular lens were lined up with the Verion alignment.  Balanced salt solution was then used to hydrate the wounds. Cefuroxime 0.1 ml of a 10mg /ml solution was injected into the anterior chamber for a dose of 1 mg of intracameral antibiotic at the completion of the case.    The eye was noted to have a physiologic pressure and there was no wound leak noted.   Timolol and Brimonidine drops and Maxitrol ointment were applied to the eye.  The patient was taken to the recovery room in stable condition having had no complications of anesthesia or surgery.  Garin Mata 08/25/2023, 10:26 AM

## 2023-08-25 NOTE — H&P (Signed)
 [name]   Primary Care Physician:  [name], MD Ophthalmologist: Dr. [name]  Pre-Procedure History & Physical: HPI:  Ruben Logan. is a 73 y.o. male here for ophthalmic surgery.   Past Medical History:  Diagnosis Date   Arthritis    Depression with anxiety    On Paxil   Diabetes mellitus type II, non insulin dependent (HCC)    On combination of Jardiance, Metformin, Trulicity   Diverticulosis    GERD (gastroesophageal reflux disease)    Hearing loss    History of bladder cancer 2005   Hx of adenomatous colonic polyps    Hyperlipidemia    On Zetia   Kidney stones    Left-sided chest wall pain 04/2020   Coronary CTA: Coronary Calcium Score of 0.  Low risk.; Normal coronary origin with RCA dominance.; No evidence of CAD. Mild tapering of the LAD at D2  branch.; CAD-RADS 0. No evidence of CAD (0%). Consider non-atherosclerotic causes of chest pain.   OSA on CPAP 12/29/2013   Severe OSA-Dr. Vickey Huger    Past Surgical History:  Procedure Laterality Date   bladder cancer  2005   COLONOSCOPY     Depression     On and off   kidney stones     Presumably lithotripsy   NM MYOVIEW LTD  05/2012   Order for abnormal EKG: [facility]):  Fair exercise capacity.  Hypertensive diastolic blood pressure.  No EKG changes consistent with ischemia.  Normal LV function.  Normal wall motion.  No ischemia or infarction.--LOW RISK    Prior to Admission medications   Medication Sig Start Date End Date Taking? Authorizing Provider  aspirin 81 MG tablet Take 81 mg by mouth daily.   Yes [provider]  cetirizine (ZYRTEC) 10 MG tablet Take 10 mg by mouth daily.   Yes [provider]  Dulaglutide (TRULICITY [place]) Inject 1 Dose into the skin once a week.   Yes [provider]  ezetimibe (ZETIA) 10 MG tablet Take 10 mg by mouth daily.   Yes [provider]  famotidine (PEPCID) 20 MG tablet Take 20 mg by mouth daily.   Yes [provider]  fexofenadine (ALLEGRA) 180 MG tablet Take 180 mg by mouth daily. As needed   Yes [provider]  metFORMIN (GLUMETZA) 1000 MG (MOD) 24 hr tablet Take 1,000 mg by mouth 2 (two) times daily with a meal.    Yes [provider]  mirabegron ER (MYRBETRIQ) 50 MG TB24 tablet Take 1 tablet by mouth daily. 04/15/20  Yes [provider]  PARoxetine (PAXIL) 40 MG tablet Take 40 mg by mouth daily.   Yes [provider]  Specialty Vitamins Products (COLLAGEN ULTRA PO) Take by mouth.   Yes [provider]  tamsulosin (FLOMAX) 0.4 MG CAPS capsule Take by mouth. 03/31/19  Yes [provider]  UNABLE TO FIND Med Name: CBD 50MG  at night   Yes [provider]  vitamin E 1000 UNIT capsule Take 1,000 Units by mouth daily.   Yes [provider]  empagliflozin (JARDIANCE) 25 MG TABS tablet Take 25 mg by mouth daily. Patient not taking: Reported on 08/19/2023    [provider]  OVER THE COUNTER MEDICATION 50 mg daily. CBD    [provider]  oxybutynin (DITROPAN) 5 MG tablet Take 5 mg by mouth.  02/14/19   [provider]  Respiratory Therapy Supplies (CARETOUCH 2 CPAP HOSE HANGER) MISC Wears it nightly.  11 cm h20 pressure setting 03/09/12   [provider]  sildenafil (REVATIO) 20 MG tablet Take by mouth. 11/13/19   [provider]    Allergies as of 08/12/2023 - Review Complete 09/04/2022  Allergen Reaction Noted   Midazolam Other (See Comments) 06/24/2015   Ozempic (0.25 or 0.5 mg-dose) [semaglutide(0.25 or 0.5mg -dos)]  09/15/2018   Prozac [fluoxetine hcl]  04/28/2020   Statins Itching 06/23/2012    Family History  Problem Relation Age of Onset   Diabetes Mellitus II Mother    Alzheimer's disease Mother    Heart attack Father 50       Initial MI early 56s, died as a result of his third MI)   Coronary artery disease Father 41   Diabetes Sister    Sleep apnea Sister    Sleep  apnea Sister    Coronary artery disease Paternal Uncle 86   Heart attack Paternal Uncle 65       Died as a result of CAD-MI in his late 55s   Coronary artery disease Son 88   Heart attack Son 37       Left main CAD-ischemic cardiomyopathy   Cardiomyopathy Son 83       Ischemic-ICD, now status post transplant with ICD.   Alcohol abuse Son    Factor V Leiden deficiency Son    Colon cancer Neg Hx    Esophageal cancer Neg Hx    Stomach cancer Neg Hx    Rectal cancer Neg Hx     Social History   Socioeconomic History   Marital status: Married    Spouse name: [name]   Number of children: 1   Years of education: 14   Highest education level: Not on file  Occupational History   Not on file  Tobacco Use   Smoking status: Former    Current packs/day: 0.00    Average packs/day: 1 pack/day for 46.0 years (46.0 ttl pk-yrs)    Types: Cigarettes    Start date: 03/16/1964    Quit date: 03/16/2010    Years since quitting: 13.4   Smokeless tobacco: Never  Substance and Sexual Activity   Alcohol use: No   Drug use: No   Sexual activity: Not on file  Other Topics Concern   Not on file  Social History Narrative   Patient is married [name]) and lives at home with his wife, and adult son. ->  They have 2 grandchildren ([name], 12-> significant handicap-autism spectrum and [name], 10)   Patient has one adult child  - [name], who lives with them since 2018-> He is s/p Heart Transplant (as a result of Large Anterior STEMI - LM CAD); separated from his family (divorce between 2015-2017), -> during the divorce settlement, the grandchildren stay with them every other weekend.      --> [name] has problems with EtOH and polysubstance abuse, Med Non-compliant which is a major issue in a transplant patient, starting to talk about wanting to "give up   --> there has been lots of stress for [name] & [name] trying to keep [name] on track with his medical compliance.  There is concern about possible transplant  rejection, thankfully that was not the case.  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   Patient drinks  four cups of caffeine daily (coffee, soda and tea).      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   Social Drivers of Corporate investment banker Strain: Not on file  Food Insecurity: Not on file  Transportation Needs: Not on file  Physical Activity: Not on file  Stress: Not on file  Social Connections: Not on file  Intimate Partner Violence: Not on file    Review of Systems: See HPI, otherwise negative ROS  Physical Exam: BP (!) 148/66   Pulse 72   Temp 97.6 F (36.4 C) (Temporal)   Resp 18   Ht 5\' 10"  (1.778 m)   Wt 95.3 kg   SpO2 99%   BMI 30.13 kg/m  General:   Alert,  pleasant and cooperative in NAD Head:  Normocephalic and atraumatic. Lungs:  Clear to auscultation.    Heart:  Regular rate and rhythm.   Impression/Plan: Ruben Logan. is here for ophthalmic surgery.  Risks, benefits, limitations, and alternatives regarding ophthalmic surgery have been reviewed with the patient.  Questions have been answered.  All parties agreeable.   [name], MD  08/25/2023, 9:43 AM

## 2023-08-26 ENCOUNTER — Encounter: Payer: Self-pay | Admitting: Ophthalmology

## 2023-09-06 NOTE — Anesthesia Preprocedure Evaluation (Addendum)
 Anesthesia Evaluation  Patient identified by MRN, date of birth, ID band Patient awake    Reviewed: Allergy & Precautions, H&P , NPO status , Patient's Chart, lab work & pertinent test results  Airway Mallampati: III  TM Distance: >3 FB Neck ROM: Full    Dental no notable dental hx.    Pulmonary neg pulmonary ROS, sleep apnea , former smoker   Pulmonary exam normal breath sounds clear to auscultation       Cardiovascular hypertension, negative cardio ROS Normal cardiovascular exam Rhythm:Regular Rate:Normal     Neuro/Psych  PSYCHIATRIC DISORDERS Anxiety Depression    negative neurological ROS  negative psych ROS   GI/Hepatic negative GI ROS, Neg liver ROS,GERD  ,,  Endo/Other  negative endocrine ROSdiabetes    Renal/GU Renal diseasenegative Renal ROS  negative genitourinary   Musculoskeletal negative musculoskeletal ROS (+) Arthritis ,    Abdominal   Peds negative pediatric ROS (+)  Hematology negative hematology ROS (+)   Anesthesia Other Findings Previous cataract surgery 08-25-23  Kidney stones  Depression with anxiety OSA on CPAP  Hyperlipidemia History of bladder cancer  Hx of adenomatous colonic polyps Diverticulosis  Hearing loss Diabetes mellitus type II, non insulin dependent (HCC)  Left-sided chest wall pain Arthritis  GERD (gastroesophageal reflux disease)    Reproductive/Obstetrics negative OB ROS                             Anesthesia Physical Anesthesia Plan  ASA: 3  Anesthesia Plan: MAC   Post-op Pain Management:    Induction: Intravenous  PONV Risk Score and Plan:   Airway Management Planned: Natural Airway and Nasal Cannula  Additional Equipment:   Intra-op Plan:   Post-operative Plan:   Informed Consent: I have reviewed the patients History and Physical, chart, labs and discussed the procedure including the risks, benefits and alternatives for  the proposed anesthesia with the patient or authorized representative who has indicated his/her understanding and acceptance.     Dental Advisory Given  Plan Discussed with: Anesthesiologist, CRNA and Surgeon  Anesthesia Plan Comments: (Patient consented for risks of anesthesia including but not limited to:  - adverse reactions to medications - damage to eyes, teeth, lips or other oral mucosa - nerve damage due to positioning  - sore throat or hoarseness - Damage to heart, brain, nerves, lungs, other parts of body or loss of life  Patient voiced understanding and assent.)        Anesthesia Quick Evaluation

## 2023-09-07 NOTE — Discharge Instructions (Signed)

## 2023-09-08 ENCOUNTER — Encounter
Admission: RE | Disposition: A | Discharge status: Home / Self Care | Payer: Self-pay | Source: Home / Self Care | Attending: Ophthalmology

## 2023-09-08 ENCOUNTER — Ambulatory Visit: Payer: Self-pay | Admitting: Anesthesiology

## 2023-09-08 ENCOUNTER — Ambulatory Visit
Admission: RE | Admit: 2023-09-08 | Discharge: 2023-09-08 | Disposition: A | Discharge status: Home / Self Care | Attending: Ophthalmology | Admitting: Ophthalmology

## 2023-09-08 ENCOUNTER — Other Ambulatory Visit: Payer: Self-pay

## 2023-09-08 ENCOUNTER — Encounter: Payer: Self-pay | Admitting: Ophthalmology

## 2023-09-08 DIAGNOSIS — E1136 Type 2 diabetes mellitus with diabetic cataract: Secondary | ICD-10-CM | POA: Insufficient documentation

## 2023-09-08 DIAGNOSIS — K219 Gastro-esophageal reflux disease without esophagitis: Secondary | ICD-10-CM | POA: Diagnosis not present

## 2023-09-08 DIAGNOSIS — G4733 Obstructive sleep apnea (adult) (pediatric): Secondary | ICD-10-CM | POA: Diagnosis not present

## 2023-09-08 DIAGNOSIS — Z7984 Long term (current) use of oral hypoglycemic drugs: Secondary | ICD-10-CM | POA: Diagnosis not present

## 2023-09-08 DIAGNOSIS — H5703 Miosis: Secondary | ICD-10-CM | POA: Diagnosis not present

## 2023-09-08 DIAGNOSIS — Z87891 Personal history of nicotine dependence: Secondary | ICD-10-CM | POA: Insufficient documentation

## 2023-09-08 DIAGNOSIS — I10 Essential (primary) hypertension: Secondary | ICD-10-CM | POA: Diagnosis not present

## 2023-09-08 DIAGNOSIS — H2511 Age-related nuclear cataract, right eye: Secondary | ICD-10-CM | POA: Insufficient documentation

## 2023-09-08 DIAGNOSIS — Z7985 Long-term (current) use of injectable non-insulin antidiabetic drugs: Secondary | ICD-10-CM | POA: Diagnosis not present

## 2023-09-08 HISTORY — PX: CATARACT EXTRACTION W/PHACO: SHX586

## 2023-09-08 LAB — GLUCOSE, CAPILLARY: Glucose-Capillary: 175 mg/dL — ABNORMAL HIGH (ref 70–99)

## 2023-09-08 SURGERY — PHACOEMULSIFICATION, CATARACT, WITH IOL INSERTION
Anesthesia: Monitor Anesthesia Care | Site: Eye | Laterality: Right

## 2023-09-08 MED ORDER — ARMC OPHTHALMIC DILATING DROPS
1.0000 | OPHTHALMIC | Status: DC | PRN
  Administered 2023-09-08 (×3): 1 via OPHTHALMIC

## 2023-09-08 MED ORDER — LIDOCAINE HCL (PF) 2 % IJ SOLN
INTRAOCULAR | Status: DC | PRN
  Administered 2023-09-08: 2 mL

## 2023-09-08 MED ORDER — MIDAZOLAM HCL 2 MG/2ML IJ SOLN
INTRAMUSCULAR | Status: AC
Start: 2023-09-08 — End: ?
  Filled 2023-09-08: qty 2

## 2023-09-08 MED ORDER — ARMC OPHTHALMIC DILATING DROPS
OPHTHALMIC | Status: AC
  Filled 2023-09-08: qty 0.5

## 2023-09-08 MED ORDER — TETRACAINE HCL 0.5 % OP SOLN
1.0000 [drp] | OPHTHALMIC | Status: DC | PRN
  Administered 2023-09-08 (×3): 1 [drp] via OPHTHALMIC

## 2023-09-08 MED ORDER — BRIMONIDINE TARTRATE-TIMOLOL 0.2-0.5 % OP SOLN
OPHTHALMIC | Status: DC | PRN
  Administered 2023-09-08: 1 [drp] via OPHTHALMIC

## 2023-09-08 MED ORDER — FENTANYL CITRATE (PF) 100 MCG/2ML IJ SOLN
INTRAMUSCULAR | Status: AC
  Filled 2023-09-08: qty 2

## 2023-09-08 MED ORDER — CEFUROXIME OPHTHALMIC INJECTION 1 MG/0.1 ML
INJECTION | OPHTHALMIC | Status: DC | PRN
  Administered 2023-09-08: 1 mg via OPHTHALMIC

## 2023-09-08 MED ORDER — SIGHTPATH DOSE#1 BSS IO SOLN
INTRAOCULAR | Status: DC | PRN
  Administered 2023-09-08: 15 mL via INTRAOCULAR

## 2023-09-08 MED ORDER — TETRACAINE HCL 0.5 % OP SOLN
OPHTHALMIC | Status: AC
  Filled 2023-09-08: qty 4

## 2023-09-08 MED ORDER — SIGHTPATH DOSE#1 BSS IO SOLN
INTRAOCULAR | Status: DC | PRN
  Administered 2023-09-08: 69 mL via OPHTHALMIC

## 2023-09-08 MED ORDER — SIGHTPATH DOSE#1 NA HYALUR & NA CHOND-NA HYALUR IO KIT
PACK | INTRAOCULAR | Status: DC | PRN
  Administered 2023-09-08: 1 via OPHTHALMIC

## 2023-09-08 MED ORDER — FENTANYL CITRATE (PF) 100 MCG/2ML IJ SOLN
INTRAMUSCULAR | Status: DC | PRN
  Administered 2023-09-08 (×2): 50 ug via INTRAVENOUS

## 2023-09-08 SURGICAL SUPPLY — 12 items
CATARACT SUITE SIGHTPATH (MISCELLANEOUS) ×1 IMPLANT
FEE CATARACT SUITE SIGHTPATH (MISCELLANEOUS) ×1 IMPLANT
GLOVE BIOGEL PI IND STRL 8 (GLOVE) ×1 IMPLANT
GLOVE SURG LX STRL 7.5 STRW (GLOVE) ×1 IMPLANT
GLOVE SURG PROTEXIS BL SZ6.5 (GLOVE) ×1 IMPLANT
GLOVE SURG SYN 6.5 PF PI BL (GLOVE) ×1 IMPLANT
LENS CLRN VIVITY TORIC 5 18.5 ×1 IMPLANT
LENS IOL CLRN VT TRC 5 18.5 IMPLANT
NDL FILTER BLUNT 18X1 1/2 (NEEDLE) ×1 IMPLANT
NEEDLE FILTER BLUNT 18X1 1/2 (NEEDLE) ×1 IMPLANT
RING MALYGIN 7.0 (MISCELLANEOUS) IMPLANT
SYR 3ML LL SCALE MARK (SYRINGE) ×1 IMPLANT

## 2023-09-08 NOTE — H&P (Signed)
 [facility]   Primary Care Physician:  [name], MD Ophthalmologist: Dr. [name]  Pre-Procedure History & Physical: HPI:  Ruben Logan. is a 73 y.o. male here for ophthalmic surgery.   Past Medical History:  Diagnosis Date   Arthritis    Depression with anxiety    On Paxil   Diabetes mellitus type II, non insulin dependent (HCC)    On combination of Jardiance, Metformin, Trulicity   Diverticulosis    GERD (gastroesophageal reflux disease)    Hearing loss    History of bladder cancer 2005   Hx of adenomatous colonic polyps    Hyperlipidemia    On Zetia   Kidney stones    Left-sided chest wall pain 04/2020   Coronary CTA: Coronary Calcium Score of 0.  Low risk.; Normal coronary origin with RCA dominance.; No evidence of CAD. Mild tapering of the LAD at D2  branch.; CAD-RADS 0. No evidence of CAD (0%). Consider non-atherosclerotic causes of chest pain.   OSA on CPAP 12/29/2013   Severe OSA-Dr. Vickey Huger    Past Surgical History:  Procedure Laterality Date   bladder cancer  2005   CATARACT EXTRACTION W/PHACO Left 08/25/2023   Procedure: PHACOEMULSIFICATION, CATARACT, WITH IOL INSERTION 7.70 00:34.1;  Surgeon: [name], MD;  Location: [facility];  Service: Ophthalmology;  Laterality: Left;   COLONOSCOPY     Depression     On and off   kidney stones     Presumably lithotripsy   NM MYOVIEW LTD  05/2012   [facility] for abnormal EKG: [facility]  Fair exercise capacity.  Hypertensive diastolic blood pressure.  No EKG changes consistent with ischemia.  Normal LV function.  Normal wall motion.  No ischemia or infarction.--LOW RISK    Prior to Admission medications   Medication Sig Start Date End Date Taking? Authorizing Provider  buPROPion (WELLBUTRIN XL) 150 MG 24 hr tablet Take 150 mg by mouth daily.   Yes [provider]  CANNABIDIOL PO Take 50 mg by mouth at bedtime.   Yes [provider]  solifenacin (VESICARE) 10 MG  tablet Take 10 mg by mouth daily.   Yes [provider]  Vibegron (GEMTESA) 75 MG TABS Take by mouth daily.   Yes [provider]  aspirin 81 MG tablet Take 81 mg by mouth daily.    [provider]  cetirizine (ZYRTEC) 10 MG tablet Take 10 mg by mouth daily.    [provider]  Dulaglutide (TRULICITY [place]) Inject 1 Dose into the skin once a week.    [provider]  empagliflozin (JARDIANCE) 25 MG TABS tablet Take 25 mg by mouth daily. Patient not taking: Reported on 08/19/2023    [provider]  ezetimibe (ZETIA) 10 MG tablet Take 10 mg by mouth daily.    [provider]  famotidine (PEPCID) 20 MG tablet Take 20 mg by mouth daily.    [provider]  fexofenadine (ALLEGRA) 180 MG tablet Take 180 mg by mouth daily. As needed    [provider]  metFORMIN (GLUMETZA) 1000 MG (MOD) 24 hr tablet Take 1,000 mg by mouth 2 (two) times daily with a meal.     [provider]  mirabegron ER (MYRBETRIQ) 50 MG TB24 tablet Take 1 tablet by mouth daily. Patient not taking: Reported on 08/31/2023 04/15/20   [provider]  oxybutynin (DITROPAN) 5 MG tablet Take 5 mg by mouth.  02/14/19   [provider]  PARoxetine (  PAXIL) 40 MG tablet Take 40 mg by mouth daily.    [provider]  Respiratory Therapy Supplies (CARETOUCH 2 CPAP HOSE HANGER) MISC Wears it nightly. 11 cm h20 pressure setting 03/09/12   [provider]  sildenafil (REVATIO) 20 MG tablet Take by mouth. Patient not taking: Reported on 08/31/2023 11/13/19   [provider]  Specialty Vitamins Products (COLLAGEN ULTRA PO) Take by mouth.    [provider]  tamsulosin (FLOMAX) 0.4 MG CAPS capsule Take by mouth. 03/31/19   [provider]  vitamin E 1000 UNIT capsule Take 1,000 Units by mouth daily.    [provider]    Allergies as of 08/12/2023 - Review Complete 09/04/2022  Allergen Reaction  Noted   Midazolam Other (See Comments) 06/24/2015   Ozempic (0.25 or 0.5 mg-dose) [semaglutide(0.25 or 0.5mg -dos)]  09/15/2018   Prozac [fluoxetine hcl]  04/28/2020   Statins Itching 06/23/2012    Family History  Problem Relation Age of Onset   Diabetes Mellitus II Mother    Alzheimer's disease Mother    Heart attack Father 21       Initial MI early 44s, died as a result of his third MI)   Coronary artery disease Father 57   Diabetes Sister    Sleep apnea Sister    Sleep apnea Sister    Coronary artery disease Paternal Uncle 36   Heart attack Paternal Uncle 75       Died as a result of CAD-MI in his late 27s   Coronary artery disease Son 33   Heart attack Son 37       Left main CAD-ischemic cardiomyopathy   Cardiomyopathy Son 62       Ischemic-ICD, now status post transplant with ICD.   Alcohol abuse Son    Factor V Leiden deficiency Son    Colon cancer Neg Hx    Esophageal cancer Neg Hx    Stomach cancer Neg Hx    Rectal cancer Neg Hx     Social History   Socioeconomic History   Marital status: Married    Spouse name: [name]   Number of children: 1   Years of education: 14   Highest education level: Not on file  Occupational History   Not on file  Tobacco Use   Smoking status: Former    Current packs/day: 0.00    Average packs/day: 1 pack/day for 46.0 years (46.0 ttl pk-yrs)    Types: Cigarettes    Start date: 03/16/1964    Quit date: 03/16/2010    Years since quitting: 13.4   Smokeless tobacco: Never  Substance and Sexual Activity   Alcohol use: No   Drug use: No   Sexual activity: Not on file  Other Topics Concern   Not on file  Social History Narrative   Patient is married [name]) and lives at home with his wife, and adult son. ->  They have 2 grandchildren ([name], 12-> significant handicap-autism spectrum and [name], 10)   Patient has one adult child  - [name], who lives with them since 2018-> He is s/p Heart Transplant (as a result of Large Anterior  STEMI - LM CAD); separated from his family (divorce between 2015-2017), -> during the divorce settlement, the grandchildren stay with them every other weekend.      --> [name] has problems with EtOH and polysubstance abuse, Med Non-compliant which is a major issue in a transplant patient, starting to talk about wanting to "give up   -->  there has been lots of stress for [name] & Tanya Fantasia trying to keep Reymundo Caulk on track with his medical compliance.  There is concern about possible transplant rejection, thankfully that was not the case.  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   Patient drinks four cups of caffeine daily (coffee, soda and tea).      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   Social Drivers of Corporate investment banker Strain: Not on file  Food Insecurity: Not on file  Transportation Needs: Not on file  Physical Activity: Not on file  Stress: Not on file  Social Connections: Not on file  Intimate Partner Violence: Not on file    Review of Systems: See HPI, otherwise negative ROS  Physical Exam: There were no vitals taken for this visit. General:   Alert,  pleasant and cooperative in NAD Head:  Normocephalic and atraumatic. Lungs:  Clear to auscultation.    Heart:  Regular rate and rhythm.   Impression/Plan: Larissa Plowman. is here for ophthalmic surgery.  Risks, benefits, limitations, and alternatives regarding ophthalmic surgery have been reviewed with the patient.  Questions have been answered.  All parties agreeable.   Annell Kidney, MD  09/08/2023, 7:28 AM

## 2023-09-08 NOTE — Transfer of Care (Signed)
 Immediate Anesthesia Transfer of Care Note  Patient: Ruben Logan.  Procedure(s) Performed: PHACOEMULSIFICATION, CATARACT, WITH IOL INSERTION 9.32 00:46.0 (Right: Eye)  Patient Location: PACU  Anesthesia Type: MAC  Level of Consciousness: awake, alert  and patient cooperative  Airway and Oxygen Therapy: Patient Spontanous Breathing and Patient connected to supplemental oxygen  Post-op Assessment: Post-op Vital signs reviewed, Patient's Cardiovascular Status Stable, Respiratory Function Stable, Patent Airway and No signs of Nausea or vomiting  Post-op Vital Signs: Reviewed and stable  Complications: No notable events documented.

## 2023-09-08 NOTE — Anesthesia Postprocedure Evaluation (Signed)
 Anesthesia Post Note  Patient: Jama Krichbaum.  Procedure(s) Performed: PHACOEMULSIFICATION, CATARACT, WITH IOL INSERTION 9.32 00:46.0 (Right: Eye)  Patient location during evaluation: PACU Anesthesia Type: MAC Level of consciousness: awake and alert Pain management: pain level controlled Vital Signs Assessment: post-procedure vital signs reviewed and stable Respiratory status: spontaneous breathing, nonlabored ventilation, respiratory function stable and patient connected to nasal cannula oxygen Cardiovascular status: stable and blood pressure returned to baseline Postop Assessment: no apparent nausea or vomiting Anesthetic complications: no   No notable events documented.   Last Vitals:  Vitals:   09/08/23 0845 09/08/23 0850  BP: (!) 149/69 (!) 142/66  Pulse: 64 67  Resp: (!) 9 12  Temp: 36.5 C (!) 36.4 C  SpO2: 100% 98%    Last Pain:  Vitals:   09/08/23 0850  TempSrc:   PainSc: 0-No pain                 Michalene Debruler C Montrell Cessna

## 2023-09-08 NOTE — Op Note (Signed)
 LOCATION:  [facility]   PREOPERATIVE DIAGNOSIS:  Nuclear sclerotic cataract of the right eye.  H25.11   POSTOPERATIVE DIAGNOSIS:  Nuclear sclerotic cataract of the right eye with miotic pupil   PROCEDURE:  Phacoemulsification with Toric posterior chamber intraocular lens placement of the right eye with Malyugin ring placement  Ultrasound time: Procedure(s): PHACOEMULSIFICATION, CATARACT, WITH IOL INSERTION 9.32 00:46.0 (Right)  LENS:   Implant Name Type Inv. Item Serial No. Manufacturer Lot No. LRB No. Used Action  LENS CLRN VIVITY TORIC 5 18.5 - [id number]  LENS CLRN VIVITY TORIC 5 18.5 [id number] [employer]  Right 1 Implanted     [id number] Vivity  Toric intraocular lens with 3.0 diopters of cylindrical power with axis orientation at 9 degrees.   SURGEON:  Berline Brenner, MD   ANESTHESIA: Topical with tetracaine drops and 2% Xylocaine jelly, augmented with 1% preservative-free intracameral lidocaine. .   COMPLICATIONS:  None.   DESCRIPTION OF PROCEDURE:  The patient was identified in the holding room and transported to the operating suite and placed in the supine position under the operating microscope.  The right eye was identified as the operative eye, and it was prepped and draped in the usual sterile ophthalmic fashion.    A clear-corneal paracentesis incision was made at the 12:00 position.  0.5 ml of preservative-free 1% lidocaine was injected into the anterior chamber. The anterior chamber was filled with Viscoat.  A 2.4 millimeter near clear corneal incision was then made at the 9:00 position.  A cystotome and capsulorrhexis forceps were then used to make a curvilinear capsulorrhexis. A Malyugin ring was placed to expand the pupil to 7mm.   Hydrodissection and hydrodelineation were then performed using balanced salt solution.   Phacoemulsification was then used in stop and chop fashion to remove the lens, nucleus and epinucleus.  The remaining cortex was aspirated  using the irrigation and aspiration handpiece.  Provisc viscoelastic was then placed into the capsular bag to distend it for lens placement.  The Verion digital marker was used to align the implant at the intended axis.   A Toric lens was then injected into the capsular bag.  It was rotated clockwise until the axis marks on the lens were approximately 15 degrees in the counterclockwise direction to the intended alignment. The Malyugin ring was removed. The viscoelastic was aspirated from the eye using the irrigation aspiration handpiece.  Then, a Koch spatula through the sideport incision was used to rotate the lens in a clockwise direction until the axis markings of the intraocular lens were lined up with the Verion alignment.  Balanced salt solution was then used to hydrate the wounds. Cefuroxime 0.1 ml of a 10mg /ml solution was injected into the anterior chamber for a dose of 1 mg of intracameral antibiotic at the completion of the case.    The eye was noted to have a physiologic pressure and there was no wound leak noted.   Timolol and Brimonidine drops were applied to the eye.  The patient was taken to the recovery room in stable condition having had no complications of anesthesia or surgery.  Dulcie Gammon 09/08/2023, 8:44 AM

## 2023-09-15 DIAGNOSIS — M7542 Impingement syndrome of left shoulder: Secondary | ICD-10-CM | POA: Diagnosis not present

## 2023-11-03 ENCOUNTER — Telehealth: Payer: Self-pay | Admitting: Adult Health

## 2023-11-03 DIAGNOSIS — M25511 Pain in right shoulder: Secondary | ICD-10-CM | POA: Diagnosis not present

## 2023-11-03 DIAGNOSIS — M542 Cervicalgia: Secondary | ICD-10-CM | POA: Diagnosis not present

## 2023-11-03 NOTE — Telephone Encounter (Signed)
 Pt has a new issue re: balance, trouble walking, falling, and speaking. I t was explained a referral for these issues will be needed. Pt reports he will see his PCP (Dr Genelle Kennedy) in the morning and will make it known that a referral is needed.

## 2023-11-04 ENCOUNTER — Ambulatory Visit (HOSPITAL_COMMUNITY)
Admission: RE | Admit: 2023-11-04 | Discharge: 2023-11-04 | Disposition: A | Discharge status: Home / Self Care | Source: Ambulatory Visit | Attending: Internal Medicine | Admitting: Internal Medicine

## 2023-11-04 ENCOUNTER — Other Ambulatory Visit (HOSPITAL_COMMUNITY): Payer: Self-pay | Admitting: Internal Medicine

## 2023-11-04 DIAGNOSIS — J341 Cyst and mucocele of nose and nasal sinus: Secondary | ICD-10-CM | POA: Diagnosis not present

## 2023-11-04 DIAGNOSIS — G3184 Mild cognitive impairment, so stated: Secondary | ICD-10-CM | POA: Diagnosis not present

## 2023-11-04 DIAGNOSIS — M6281 Muscle weakness (generalized): Secondary | ICD-10-CM | POA: Diagnosis not present

## 2023-11-04 DIAGNOSIS — R270 Ataxia, unspecified: Secondary | ICD-10-CM | POA: Diagnosis not present

## 2023-11-04 DIAGNOSIS — I6782 Cerebral ischemia: Secondary | ICD-10-CM | POA: Diagnosis not present

## 2023-11-04 DIAGNOSIS — E1165 Type 2 diabetes mellitus with hyperglycemia: Secondary | ICD-10-CM | POA: Diagnosis not present

## 2023-11-04 DIAGNOSIS — W19XXXA Unspecified fall, initial encounter: Secondary | ICD-10-CM | POA: Diagnosis not present

## 2023-11-04 DIAGNOSIS — E785 Hyperlipidemia, unspecified: Secondary | ICD-10-CM | POA: Diagnosis not present

## 2023-11-04 DIAGNOSIS — C679 Malignant neoplasm of bladder, unspecified: Secondary | ICD-10-CM | POA: Diagnosis not present

## 2023-11-04 DIAGNOSIS — G319 Degenerative disease of nervous system, unspecified: Secondary | ICD-10-CM | POA: Diagnosis not present

## 2023-11-04 DIAGNOSIS — F329 Major depressive disorder, single episode, unspecified: Secondary | ICD-10-CM | POA: Diagnosis not present

## 2023-11-04 MED ORDER — GADOBUTROL 1 MMOL/ML IV SOLN
9.5000 mL | Freq: Once | INTRAVENOUS | Status: AC | PRN
  Administered 2023-11-04: 9.5 mL via INTRAVENOUS

## 2023-11-24 DIAGNOSIS — E1165 Type 2 diabetes mellitus with hyperglycemia: Secondary | ICD-10-CM | POA: Diagnosis not present

## 2023-11-24 DIAGNOSIS — R270 Ataxia, unspecified: Secondary | ICD-10-CM | POA: Diagnosis not present

## 2023-12-07 DIAGNOSIS — G4733 Obstructive sleep apnea (adult) (pediatric): Secondary | ICD-10-CM | POA: Diagnosis not present

## 2024-01-04 DIAGNOSIS — R030 Elevated blood-pressure reading, without diagnosis of hypertension: Secondary | ICD-10-CM | POA: Diagnosis not present

## 2024-01-04 DIAGNOSIS — C679 Malignant neoplasm of bladder, unspecified: Secondary | ICD-10-CM | POA: Diagnosis not present

## 2024-01-04 DIAGNOSIS — E1165 Type 2 diabetes mellitus with hyperglycemia: Secondary | ICD-10-CM | POA: Diagnosis not present

## 2024-01-04 DIAGNOSIS — F329 Major depressive disorder, single episode, unspecified: Secondary | ICD-10-CM | POA: Diagnosis not present

## 2024-01-04 DIAGNOSIS — G3184 Mild cognitive impairment, so stated: Secondary | ICD-10-CM | POA: Diagnosis not present

## 2024-01-04 DIAGNOSIS — R270 Ataxia, unspecified: Secondary | ICD-10-CM | POA: Diagnosis not present

## 2024-01-04 DIAGNOSIS — W19XXXA Unspecified fall, initial encounter: Secondary | ICD-10-CM | POA: Diagnosis not present

## 2024-01-04 DIAGNOSIS — N390 Urinary tract infection, site not specified: Secondary | ICD-10-CM | POA: Diagnosis not present

## 2024-01-04 DIAGNOSIS — R350 Frequency of micturition: Secondary | ICD-10-CM | POA: Diagnosis not present

## 2024-01-18 ENCOUNTER — Ambulatory Visit: Admitting: Neurology

## 2024-01-18 ENCOUNTER — Encounter: Payer: Self-pay | Admitting: Neurology

## 2024-01-18 ENCOUNTER — Telehealth: Payer: Self-pay | Admitting: Neurology

## 2024-01-18 VITALS — BP 139/66 | HR 73 | Ht 71.0 in | Wt 210.0 lb

## 2024-01-18 DIAGNOSIS — R270 Ataxia, unspecified: Secondary | ICD-10-CM | POA: Insufficient documentation

## 2024-01-18 DIAGNOSIS — R296 Repeated falls: Secondary | ICD-10-CM | POA: Insufficient documentation

## 2024-01-18 DIAGNOSIS — S0990XA Unspecified injury of head, initial encounter: Secondary | ICD-10-CM | POA: Diagnosis not present

## 2024-01-18 DIAGNOSIS — G4733 Obstructive sleep apnea (adult) (pediatric): Secondary | ICD-10-CM

## 2024-01-18 DIAGNOSIS — R079 Chest pain, unspecified: Secondary | ICD-10-CM

## 2024-01-18 DIAGNOSIS — G4734 Idiopathic sleep related nonobstructive alveolar hypoventilation: Secondary | ICD-10-CM

## 2024-01-18 DIAGNOSIS — G219 Secondary parkinsonism, unspecified: Secondary | ICD-10-CM | POA: Diagnosis not present

## 2024-01-18 NOTE — Telephone Encounter (Signed)
 MRI order sent to [facility] to schedule. [phone]

## 2024-01-18 NOTE — Patient Instructions (Signed)
 ASSESSMENT AND PLAN :    73 y.o. year old male  here with:     1) possible supranuclear palsy with vertical gaze.      2) propulsion  with toe stand and with Romberg.     3)  left sided loss of arm swing. Falls to the left and forward.    4) OSA untreated at this time, high air leak.    ATAXIA before head trauma, but progression since  Plan :  1) MRI brain , with special attention to brain stem.  Creatinine is 1.0 .  If no vascular or other abnormality is seen, follow with DAT scan . send to PT for fall prevention.    2) Asking urology if Detrol can be replaced, concerned about anticholinergic effect .      3)  I asked Ruben Logan to resume using CPAP with a different mask. FFM ResMed F 20  to replaced with a AiRtouch F 20 .   I would like to thank Shayne Anes, MD and Verta Pagan [place] [address] [place],  [place] [id number] for allowing me to meet with this pleasant patient.

## 2024-01-18 NOTE — Progress Notes (Signed)
 Provider:  Dedra Gores, MD  Primary Care Physician:  Shayne Anes, MD [address] [place] [address]     Referring Provider: Verta Izetta HERO, Np [address] [place],  [place] [address]          Chief Complaint according to patient   Patient presents with:          Here today for new concerns. 2-3 months ago he started having concerns with balance. He states when walking he tends to lean/walk to left. He has had some falls.  Other Asked if pt has continued to use CPAP. He states in April time frame he had cataract surgery and the air blowing bothered. He stopped using the machine and has not used it. [facility].       HISTORY OF PRESENT ILLNESS:  Ruben Logan. is a 73 y.o. male patient who is here for revisit 01/18/2024 for  Ataxia, new concern.  He drifts to the left  He believes its not related to his Cataract surgery. CT scan of the head  was negative.  Reports that he and his wife and were shopping and parked way down form the store . When they left , he felt  he was walking faster and faster, and finally held on to a truck- that sh lowed him down but caused him to fall onto a truck.  The bridge of the nose and toe were injured.  This was 14 days ago.   Cataract surgery in [place].       Chief concern according to patient :  I have started to fall frequently since May.      Fam Hx : see previous note  Social HX; see previous note  , son moved back in - lack of personal space.      Review of Systems: Out of a complete 14 system review, the patient complains of only the following symptoms, and all other reviewed systems are negative.:      Social History   Socioeconomic History   Marital status: Married    Spouse name: [name]   Number of children: 1   Years of education: 14   Highest education level: Not on file  Occupational History   Not on file  Tobacco Use   Smoking status: Former    Current packs/day: 0.00     Average packs/day: 1 pack/day for 46.0 years (46.0 ttl pk-yrs)    Types: Cigarettes    Start date: 03/16/1964    Quit date: 03/16/2010    Years since quitting: 13.8   Smokeless tobacco: Never  Substance and Sexual Activity   Alcohol  use: No   Drug use: No   Sexual activity: Not on file  Other Topics Concern   Not on file  Social History Narrative   Patient is married [name] and lives at home with his wife, and adult son. ->  They have 2 grandchildren ([name], 12-> significant handicap-autism spectrum and [name], 10)   Patient has one adult child  - [name], who lives with them since 2018-> He is s/p Heart Transplant (as a result of Large Anterior STEMI - LM CAD); separated from his family (divorce between 2015-2017), -> during the divorce settlement, the grandchildren stay with them every other weekend.      --> [name] has problems with EtOH and polysubstance abuse, Med Non-compliant which is a major issue in a transplant patient, starting to talk about wanting  to give up   --> there has been lots of stress for [name] & [name] trying to keep [name] on track with his medical compliance.  There is concern about possible transplant rejection, thankfully that was not the case.  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   Patient drinks four cups of caffeine daily (coffee, soda and tea).      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   Social Drivers of Corporate investment banker Strain: Not on file  Food Insecurity: Not on file  Transportation Needs: Not on file  Physical Activity: Not on file  Stress: Not on file  Social Connections: Not on file    Family History  Problem Relation Age of Onset   Diabetes  Mellitus II Mother    Alzheimer's disease Mother    Heart attack Father 21       Initial MI early 35s, died as a result of his third MI)   Coronary artery disease Father 63   Diabetes Sister    Sleep apnea Sister    Sleep apnea Sister    Coronary artery disease Paternal Uncle 33   Heart attack Paternal Uncle 20       Died as a result of CAD-MI in his late 47s   Coronary artery disease Son 79   Heart attack Son 37       Left main CAD-ischemic cardiomyopathy   Cardiomyopathy Son 68       Ischemic-ICD, now status post transplant with ICD.   Alcohol  abuse Son    Factor V Leiden deficiency Son    Colon cancer Neg Hx    Esophageal cancer Neg Hx    Stomach cancer Neg Hx    Rectal cancer Neg Hx     Past Medical History:  Diagnosis Date   Arthritis    Depression with anxiety    On Paxil   Diabetes mellitus type II, non insulin dependent (HCC)    On combination of Jardiance, Metformin, Trulicity   Diverticulosis    GERD (gastroesophageal reflux disease)    Hearing loss    History of bladder cancer 2005   Hx of adenomatous colonic polyps    Hyperlipidemia    On Zetia   Kidney stones    Left-sided chest wall pain 04/2020   Coronary CTA: Coronary Calcium Score of 0.  Low risk.; Normal coronary origin with RCA dominance.; No evidence of CAD. Mild tapering of the LAD at D2  branch.; CAD-RADS 0. No evidence of CAD (0%). Consider non-atherosclerotic causes of chest pain.   OSA on CPAP 12/29/2013   Severe OSA-Dr. Chalice    Past Surgical History:  Procedure Laterality Date   bladder cancer  2005   CATARACT EXTRACTION W/PHACO Left 08/25/2023   Procedure: PHACOEMULSIFICATION, CATARACT, WITH IOL INSERTION 7.70 00:34.1;  Surgeon: Mittie Gaskin, MD;  Location: [facility];  Service: Ophthalmology;  Laterality: Left;   CATARACT EXTRACTION W/PHACO Right 09/08/2023   Procedure: PHACOEMULSIFICATION, CATARACT, WITH IOL INSERTION 9.32 00:46.0;  Surgeon: Mittie Gaskin, MD;   Location: [facility];  Service: Ophthalmology;  Laterality: Right;   COLONOSCOPY     Depression  On and off   kidney stones     Presumably lithotripsy   NM MYOVIEW  LTD  05/2012   Order for abnormal EKG: [facility]):  Fair exercise capacity.  Hypertensive diastolic blood pressure.  No EKG changes consistent with ischemia.  Normal LV function.  Normal wall motion.  No ischemia or infarction.--LOW RISK     Current Outpatient Medications on File Prior to Visit  Medication Sig Dispense Refill   aspirin 81 MG tablet Take 81 mg by mouth daily.     buPROPion (WELLBUTRIN XL) 150 MG 24 hr tablet Take 150 mg by mouth daily.     CANNABIDIOL PO Take 50 mg by mouth at bedtime.     cetirizine (ZYRTEC) 10 MG tablet Take 10 mg by mouth daily.     Dulaglutide (TRULICITY [place]) Inject 1 Dose into the skin once a week.     ezetimibe (ZETIA) 10 MG tablet Take 10 mg by mouth daily.     famotidine (PEPCID) 20 MG tablet Take 20 mg by mouth daily.     fexofenadine (ALLEGRA) 180 MG tablet Take 180 mg by mouth daily. As needed     metFORMIN (GLUMETZA) 1000 MG (MOD) 24 hr tablet Take 1,000 mg by mouth 2 (two) times daily with a meal.      oxybutynin (DITROPAN) 5 MG tablet Take 5 mg by mouth.      PARoxetine (PAXIL) 40 MG tablet Take 40 mg by mouth daily.     Respiratory Therapy Supplies (CARETOUCH 2 CPAP HOSE HANGER) MISC Wears it nightly. 11 cm h20 pressure setting     sildenafil (REVATIO) 20 MG tablet Take by mouth.     solifenacin (VESICARE) 10 MG tablet Take 10 mg by mouth daily.     Specialty Vitamins Products (COLLAGEN ULTRA PO) Take by mouth.     tamsulosin (FLOMAX) 0.4 MG CAPS capsule Take by mouth. (Patient taking differently: Take 0.4 mg by mouth in the morning and at bedtime.)     Vibegron (GEMTESA) 75 MG TABS Take by mouth daily.     vitamin E 1000 UNIT capsule Take 1,000 Units by mouth daily.     No current facility-administered medications on file prior to visit.    Allergies  Allergen  Reactions   Midazolam  Other (See Comments)    Agitated    Ozempic (0.25 Or 0.5 Mg-Dose) [Semaglutide(0.25 Or 0.5mg -Dos)]     Heartburn abdominal pain as well as diarrhea and irritability   Prozac [Fluoxetine Hcl]     Made him suicidal   Statins Itching    Rosuvastatin, pravastatin, simvastatin: Aches and pains with GI upset     DIAGNOSTIC DATA (LABS, IMAGING, TESTING) - I reviewed patient records, labs, notes, testing and imaging myself where available.  No results found for: WBC, HGB, HCT, MCV, PLT    Component Value Date/Time   NA 140 05/07/2020 1446   K 4.5 05/07/2020 1446   CL 103 05/07/2020 1446   CO2 22 05/07/2020 1446   GLUCOSE 171 (H) 05/07/2020 1446   BUN 13 05/07/2020 1446   CREATININE 0.96 05/07/2020 1446   CALCIUM 9.7 05/07/2020 1446   GFRNONAA 79 05/07/2020 1446   GFRAA 92 05/07/2020 1446   No results found for: CHOL, HDL, LDLCALC, LDLDIRECT, TRIG, CHOLHDL No results found for: YHAJ8R No results found for: VITAMINB12 No results found for: TSH  CT head  MPRESSION: 1. No evidence of an acute intracranial abnormality. 2. Mild chronic small vessel ischemic changes within the cerebral white matter.  3. Mild-to-moderate generalized cerebral atrophy. 4. 16 mm left maxillary sinus mucous retention cyst.     Electronically Signed   By: Rockey Childs D.O.   On: 11/04/2023 13:49 PHYSICAL EXAM:  Vitals:   01/18/24 0829  BP: 139/66  Pulse: 73   No data found. Body mass index is 29.29 kg/m.   Wt Readings from Last 3 Encounters:  01/18/24 210 lb (95.3 kg)  09/08/23 213 lb (96.6 kg)  08/25/23 210 lb (95.3 kg)     Ht Readings from Last 3 Encounters:  01/18/24 5' 11 (1.803 m)  09/08/23 5' 10 (1.778 m)  08/25/23 5' 10 (1.778 m)      General: The patient is awake, alert and appears not in acute distress and groomed. Head: Normocephalic, atraumatic.  Neck is supple. Mallampati 2,  neck circumference:16 inches .   Nasal  airflow  patent.   Overbite Dwan is not seen.  Dental status: biological  Cardiovascular:  Regular rate and cardiac rhythm by pulse, without distended neck veins. Respiratory: no shortness of breath  Skin:  Without evidence of ankle edema, or rash. Trunk: BMI is 29    NEUROLOGIC EXAM: The patient is awake and alert, oriented to place and time.   Memory subjective described as intact.  Attention span & concentration ability appears normal.   Speech is fluent,  without  dysarthria, dysphonia or aphasia.  Mood and affect are appropriate.   Neurological Examination: Mental Status: Intact.  Language and speech are normal.  No cognitive deficits. Cranial Nerves  : cataract surgery left him with small pupils, little reactivity to light.  Tracing an object with his gaze was impaired. Upward and downward gaze.   No facial droop.  No ptosis.  Hearing is grossly intact .  The tongue is normal and midline.  Motor: Strengths are 5/5 throughout.  Muscle bulk is symmetric . No resting tremors.  Clearly elevated overall tone, without cogwheeling bilaterally, inability to relax.  More of a Clasp knife tone.  Coordination: dysmetria and action tremor on the right.   Sensory: vibration intact, temperature intact, fine touch  Reflexes: Normal and symmetric throughout. No ankle clonus.  Babinski's sign is absent bilaterally. Hoffman's sign is absent bilaterally. Gait and Station: slowed and turns with 5 steps, wide base. Left side lacks arm-swing.  Drifted suddenly to the left,  missteps  Tip toe stand let to falling forward.   Romberg's sign is positive,  swaying forward   ASSESSMENT AND PLAN :   73 y.o. year old male  here with:    1) possible supranuclear palsy with vertical gaze.    2) propulsion  with toe stand and with Romberg.    3)  left sided loss of arm swing. Falls to the left and forward.   4) OSA untreated at this time, high air leak.   ATAXIA before head  trauma, but progression since  Plan :  1) MRI brain , with special attention to brain stem.  Creatinine is 1.0 .  If no vascular or other abnormality is seen, follow with DAT scan . send to PT for fall prevention.   2) Asking urology if Detrol can be replaced, concerned about anticholinergic effect .     3)  I asked Mr Trefry to resume using CPAP with a different mask. FFM ResMed F 20  to replaced with a AiRtouch F 20 .  I would like to thank Shayne Anes, MD and [place], [place] [address] [place],  [place] [address] for allowing me to meet with this pleasant patient.     Sleep Clinic Patients are generally offered input on sleep hygiene, life style changes and how to improve compliance with medical treatment where applicable. Review and reiteration of good sleep hygiene measures is offered to any sleep clinic patient, be it in the first consultation or with any follow up visits.    Any patient with sleepiness should be cautioned not to drive, work at heights, or operate dangerous or heavy equipment when feeling tired or sleepy.      The patient will be seen in follow-up in the sleep clinic at [facility] for discussion of test results, sleep related symptoms and treatment compliance review, further management strategies, etc.   The referring provider will be notified of the test results.   The patient's condition requires frequent monitoring and adjustments in the treatment plan, reflecting the ongoing complexity of care.  This provider is the continuing focal point for all needed services for this condition.  After spending a total time of  35  minutes face to face and time for  history taking, physical and neurologic examination, review of laboratory studies,  personal review of imaging studies, reports and results of other testing and review of referral information / records as far as provided in visit,   Electronically signed by: Dedra Gores, MD 01/18/2024 8:40 AM  [facility] Board certified by The [employer] of Sleep Medicine and Diplomate of the [employer] of Sleep Medicine. Board certified In Neurology through the ABPN, Fellow of the [employer] of Neu[employer].

## 2024-02-03 DIAGNOSIS — C672 Malignant neoplasm of lateral wall of bladder: Secondary | ICD-10-CM | POA: Diagnosis not present

## 2024-02-03 DIAGNOSIS — N3941 Urge incontinence: Secondary | ICD-10-CM | POA: Diagnosis not present

## 2024-02-03 DIAGNOSIS — N200 Calculus of kidney: Secondary | ICD-10-CM | POA: Diagnosis not present

## 2024-02-03 DIAGNOSIS — N529 Male erectile dysfunction, unspecified: Secondary | ICD-10-CM | POA: Diagnosis not present

## 2024-02-09 ENCOUNTER — Encounter: Payer: Self-pay | Admitting: Neurology

## 2024-02-16 ENCOUNTER — Other Ambulatory Visit

## 2024-03-03 ENCOUNTER — Other Ambulatory Visit

## 2024-03-14 ENCOUNTER — Ambulatory Visit
Admission: RE | Admit: 2024-03-14 | Discharge: 2024-03-14 | Disposition: A | Discharge status: Home / Self Care | Source: Ambulatory Visit | Attending: Neurology | Admitting: Neurology

## 2024-03-14 DIAGNOSIS — G219 Secondary parkinsonism, unspecified: Secondary | ICD-10-CM

## 2024-03-14 DIAGNOSIS — S0990XA Unspecified injury of head, initial encounter: Secondary | ICD-10-CM

## 2024-03-14 DIAGNOSIS — G4733 Obstructive sleep apnea (adult) (pediatric): Secondary | ICD-10-CM

## 2024-03-14 DIAGNOSIS — R079 Chest pain, unspecified: Secondary | ICD-10-CM

## 2024-03-14 DIAGNOSIS — R296 Repeated falls: Secondary | ICD-10-CM | POA: Diagnosis not present

## 2024-03-14 MED ORDER — GADOPICLENOL 0.5 MMOL/ML IV SOLN
10.0000 mL | Freq: Once | INTRAVENOUS | Status: AC | PRN
  Administered 2024-03-14: 10 mL via INTRAVENOUS

## 2024-03-17 ENCOUNTER — Other Ambulatory Visit

## 2024-03-17 ENCOUNTER — Ambulatory Visit: Payer: Self-pay | Admitting: Neurology

## 2024-03-17 DIAGNOSIS — G219 Secondary parkinsonism, unspecified: Secondary | ICD-10-CM

## 2024-03-17 DIAGNOSIS — R296 Repeated falls: Secondary | ICD-10-CM

## 2024-03-23 NOTE — Telephone Encounter (Signed)
 Spoke with patient and discussed results of most recent MRI as noted below by Dr Chalice. Pt had already gotten a call from imaging facility about next one. He knows it will be repeated in 6 months and he said they are to call him when it gets closer. He also said he would like to see Dr Chalice sooner than his next visit on 11/26 so he can talk to her in-person. I placed him on the wait list and told him I would inform Dr Chalice. He thanked me for the call.

## 2024-03-23 NOTE — Telephone Encounter (Signed)
-----   Message from Edie Dohmeier sent at 03/17/2024 12:59 PM EDT ----- This MRI is abnormal, showing an increased number of small vessel injuries since June. None is acute or bleeding.  Not an emergency but needs another MRI in 6 months.  ----- Message ----- From: Margaret Eduard SAUNDERS, MD Sent: 03/16/2024   6:11 PM EDT To: Dedra Gores, MD

## 2024-03-23 NOTE — Telephone Encounter (Signed)
 On  03-24-19 @11 :32 am  Pt LVM  requesting for Nurse to call back . Pt states he heard some of the conversation but he just would like Nurse to go back over Results if possible .

## 2024-03-27 NOTE — Telephone Encounter (Signed)
-----   Message from Perla Simpers sent at 03/23/2024  1:37 PM EDT -----

## 2024-03-27 NOTE — Telephone Encounter (Signed)
 I relayed results of MRI brain to pt and wife per Dr. Chalice.  They both verbalized understanding.  This MRI is abnormal, showing an increased number of small vessel injuries since June. None is acute or bleeding.  Not an emergency but needs another MRI in 6 months. The ring-like appearance is often due to an enlarged space around a blood vessel - this is not a sign of bleeding , stroke or demyelination ( MS ) .  He is on waitlist.  Has appt 04-19-2024 at 1030.

## 2024-03-28 DIAGNOSIS — Z8673 Personal history of transient ischemic attack (TIA), and cerebral infarction without residual deficits: Secondary | ICD-10-CM | POA: Diagnosis not present

## 2024-03-28 DIAGNOSIS — C679 Malignant neoplasm of bladder, unspecified: Secondary | ICD-10-CM | POA: Diagnosis not present

## 2024-03-28 DIAGNOSIS — E785 Hyperlipidemia, unspecified: Secondary | ICD-10-CM | POA: Diagnosis not present

## 2024-03-28 DIAGNOSIS — G3184 Mild cognitive impairment, so stated: Secondary | ICD-10-CM | POA: Diagnosis not present

## 2024-03-28 DIAGNOSIS — E1165 Type 2 diabetes mellitus with hyperglycemia: Secondary | ICD-10-CM | POA: Diagnosis not present

## 2024-03-28 DIAGNOSIS — F329 Major depressive disorder, single episode, unspecified: Secondary | ICD-10-CM | POA: Diagnosis not present

## 2024-03-28 DIAGNOSIS — R270 Ataxia, unspecified: Secondary | ICD-10-CM | POA: Diagnosis not present

## 2024-03-28 DIAGNOSIS — M6281 Muscle weakness (generalized): Secondary | ICD-10-CM | POA: Diagnosis not present

## 2024-03-30 DIAGNOSIS — E119 Type 2 diabetes mellitus without complications: Secondary | ICD-10-CM | POA: Diagnosis not present

## 2024-03-30 DIAGNOSIS — Z961 Presence of intraocular lens: Secondary | ICD-10-CM | POA: Diagnosis not present

## 2024-03-30 DIAGNOSIS — H43811 Vitreous degeneration, right eye: Secondary | ICD-10-CM | POA: Diagnosis not present

## 2024-03-30 DIAGNOSIS — H40053 Ocular hypertension, bilateral: Secondary | ICD-10-CM | POA: Diagnosis not present

## 2024-04-19 ENCOUNTER — Encounter: Payer: Self-pay | Admitting: Neurology

## 2024-04-19 ENCOUNTER — Ambulatory Visit: Admitting: Neurology

## 2024-04-19 VITALS — BP 126/56 | HR 65 | Ht 71.0 in | Wt 199.6 lb

## 2024-04-19 DIAGNOSIS — R4189 Other symptoms and signs involving cognitive functions and awareness: Secondary | ICD-10-CM | POA: Diagnosis not present

## 2024-04-19 DIAGNOSIS — G219 Secondary parkinsonism, unspecified: Secondary | ICD-10-CM | POA: Diagnosis not present

## 2024-04-19 DIAGNOSIS — R296 Repeated falls: Secondary | ICD-10-CM | POA: Diagnosis not present

## 2024-04-19 DIAGNOSIS — S0990XA Unspecified injury of head, initial encounter: Secondary | ICD-10-CM

## 2024-04-19 DIAGNOSIS — R270 Ataxia, unspecified: Secondary | ICD-10-CM | POA: Diagnosis not present

## 2024-04-19 MED ORDER — DONEPEZIL HCL 5 MG PO TABS: 5.0000 mg | ORAL_TABLET | Freq: Every day | ORAL | 0 refills | Status: DC

## 2024-04-19 NOTE — Progress Notes (Signed)
 Provider:  Dedra Gores, MD  Primary Care Physician:  Ruben Anes, MD [address] [place] [address]     Referring Provider: Shayne Anes, Md [address] [place],  [place] [address]          Chief Complaint according to patient   Patient presents with:                HISTORY OF PRESENT ILLNESS:  Ruben Logan. is a 72 y.o. male patient who is here for revisit 04/19/2024 for  recently found occipital stroke , new since June 25. Dx on 03-23-2024.   May affect his vision.  He needs a visual field test.   PT for  gait stabilization. Parkinsonian propulsive gait.- higher fall risk.     MOCA was severely impaired, 14/ 30.  Needs to have MMSE in the future.      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    Driving should be paused until we have a visual field exam and PT evaluation.   White matter disease on MRI - important to continue OSA treatment on CPAP and prevent hypoxia.     Review of Systems: Out of a complete 14 system review, the patient complains of only the following symptoms, and all other reviewed systems are negative.:      04/19/2024   10:49 AM  Montreal Cognitive Assessment   Visuospatial/ Executive (0/5) 0  Naming (0/3) 3  Attention: Read list of digits (0/2) 1  Attention: Read list of letters (0/1) 0  Attention: Serial 7 subtraction starting at 100 (0/3) 1  Language: Repeat phrase (0/2) 1  Language : Fluency (0/1) 0  Abstraction (0/2) 2  Delayed  Recall (0/5) 0  Orientation (0/6) 5  Total 13  Adjusted Score (based on education) 13   moca       Social History   Socioeconomic History   Marital status: Married    Spouse name: Ruben Logan   Number of children: 1   Years of education: 14   Highest education level: Not on file  Occupational History   Not on file  Tobacco Use   Smoking status: Former    Current packs/day: 0.00    Average packs/day: 1 pack/day for 46.0 years (46.0 ttl pk-yrs)    Types: Cigarettes    Start date: 03/16/1964    Quit date: 03/16/2010    Years since quitting: 14.1   Smokeless tobacco: Never  Substance and Sexual Activity   Alcohol  use: No   Drug use: No   Sexual activity: Not on file  Other Topics Concern   Not on file  Social History Narrative   Patient is married Ruben Logan) and lives at home with his wife, and adult son. ->  They have 2 grandchildren (Ruben Logan, 12-> significant handicap-autism spectrum and Ruben Logan, 10)   Patient has one adult child  Ruben Logan Mayo, who lives with them  since 2018-> He is s/p Heart Transplant (as a result of Large Anterior STEMI - LM CAD); separated from his family (divorce between 2015-2017), -> during the divorce settlement, the grandchildren stay with them every other weekend.      --> Ruben Logan has problems with EtOH and polysubstance abuse, Med Non-compliant which is a major issue in a transplant patient, starting to talk about wanting to give up   --> there has been lots of stress for Ruben Logan & Ruben Logan trying to keep Ruben Logan on track with his medical compliance.  There is concern about possible transplant rejection, thankfully that was not the case.  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   Patient drinks four cups of caffeine daily (coffee, soda and tea).      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   Social Drivers of Health   Financial Resource Strain: Not on file  Food Insecurity: Low Risk  (02/03/2024)   Received from [facility]   Hunger Vital Sign    Within the past 12 months, you worried that your food would run out before you got money to buy more: Never true    Within the past 12 months, the food you bought just didn't last and you didn't have money to get more. : Never true  Transportation Needs: No Transportation Needs (02/03/2024)   Received from [facility]    In the past 12 months, has lack of reliable transportation kept you from medical appointments, meetings, work or from getting things needed for daily living? : No  Physical Activity: Not on file  Stress: Not on file  Social Connections: Not on file    Family History  Problem Relation Age of Onset   Diabetes Mellitus II Mother    Alzheimer's disease Mother    Heart attack Father 46       Initial MI early 29s, died as a result of his third MI)   Coronary artery disease Father 106   Diabetes Sister    Sleep apnea Sister    Sleep apnea Sister    Coronary artery disease Son 61   Heart attack Son 37       Left main CAD-ischemic cardiomyopathy   Cardiomyopathy Son 57       Ischemic-ICD, now status post transplant with ICD.   Alcohol  abuse Son    Factor V Leiden deficiency Son    Coronary artery disease Paternal Uncle 70   Heart attack Paternal Uncle 35       Died as a result of CAD-MI in his late 68s   Colon cancer Neg Hx    Esophageal cancer Neg Hx    Stomach cancer Neg Hx    Rectal cancer Neg Hx    Stroke Neg Hx    Seizures Neg Hx    Migraines Neg Hx     Past Medical History:  Diagnosis Date   Arthritis    Depression with anxiety    On Paxil   Diabetes mellitus type II, non insulin dependent (HCC)    On combination of Jardiance, Metformin, Trulicity   Diverticulosis     GERD (gastroesophageal reflux disease)  Hearing loss    History of bladder cancer 2005   Hx of adenomatous colonic polyps    Hyperlipidemia    On Zetia   Kidney stones    Left-sided chest wall pain 04/2020   Coronary CTA: Coronary Calcium Score of 0.  Low risk.; Normal coronary origin with RCA dominance.; No evidence of CAD. Mild tapering of the LAD at D2  branch.; CAD-RADS 0. No evidence of CAD (0%). Consider non-atherosclerotic causes of chest pain.   OSA on CPAP 12/29/2013   Severe OSA-Dr. Chalice    Past Surgical History:  Procedure Laterality Date   bladder cancer  2005   CATARACT EXTRACTION W/PHACO Left 08/25/2023   Procedure: PHACOEMULSIFICATION, CATARACT, WITH IOL INSERTION 7.70 00:34.1;  Surgeon: Mittie Gaskin, MD;  Location[facility];  Service: Ophthalmology;  Laterality: Left;   CATARACT EXTRACTION W/PHACO Right 09/08/2023   Procedure: PHACOEMULSIFICATION, CATARACT, WITH IOL INSERTION 9.32 00:46.0;  Surgeon: Mittie Gaskin, MD;  Location: [facility];  Service: Ophthalmology;  Laterality: Right;   COLONOSCOPY     Depression     On and off   kidney stones     Presumably lithotripsy   NM MYOVIEW  LTD  05/2012   Order for abnormal EKG: [facility]):  Fair exercise capacity.  Hypertensive diastolic blood pressure.  No EKG changes consistent with ischemia.  Normal LV function.  Normal wall motion.  No ischemia or infarction.--LOW RISK     Current Outpatient Medications on File Prior to Visit  Medication Sig Dispense Refill   aspirin 81 MG tablet Take 81 mg by mouth daily.     buPROPion (WELLBUTRIN XL) 150 MG 24 hr tablet Take 150 mg by mouth daily.     CANNABIDIOL PO Take 50 mg by mouth at bedtime.     cetirizine (ZYRTEC) 10 MG tablet Take 10 mg by mouth daily.     Dulaglutide (TRULICITY Reading) Inject 1 Dose into the skin once a week.     ezetimibe (ZETIA) 10 MG tablet Take 10 mg by mouth daily.     famotidine (PEPCID) 20 MG tablet Take 20 mg by  mouth daily.     fexofenadine (ALLEGRA) 180 MG tablet Take 180 mg by mouth daily. As needed     metFORMIN (GLUMETZA) 1000 MG (MOD) 24 hr tablet Take 1,000 mg by mouth 2 (two) times daily with a meal.      oxybutynin (DITROPAN) 5 MG tablet Take 5 mg by mouth.      PARoxetine (PAXIL) 40 MG tablet Take 40 mg by mouth daily.     Respiratory Therapy Supplies (CARETOUCH 2 CPAP HOSE HANGER) MISC Wears it nightly. 11 cm h20 pressure setting     sildenafil (REVATIO) 20 MG tablet Take by mouth.     solifenacin (VESICARE) 10 MG tablet Take 10 mg by mouth daily.     Specialty Vitamins Products (COLLAGEN ULTRA PO) Take by mouth.     tamsulosin (FLOMAX) 0.4 MG CAPS capsule Take by mouth. (Patient taking differently: Take 0.4 mg by mouth in the morning and at bedtime.)     Vibegron (GEMTESA) 75 MG TABS Take by mouth daily.     vitamin E 1000 UNIT capsule Take 1,000 Units by mouth daily.     No current facility-administered medications on file prior to visit.    Allergies  Allergen Reactions   Midazolam  Other (See Comments)    Agitated    Ozempic (0.25 Or 0.5 Mg-Dose) [Semaglutide(0.25 Or 0.5mg -Dos)]     Heartburn  abdominal pain as well as diarrhea and irritability   Prozac [Fluoxetine Hcl]     Made him suicidal   Statins Itching    Rosuvastatin, pravastatin, simvastatin: Aches and pains with GI upset     DIAGNOSTIC DATA (LABS, IMAGING, TESTING) - I reviewed patient records, labs, notes, testing and imaging myself where available.  No results found for: WBC, HGB, HCT, MCV, PLT    Component Value Date/Time   NA 140 05/07/2020 1446   K 4.5 05/07/2020 1446   CL 103 05/07/2020 1446   CO2 22 05/07/2020 1446   GLUCOSE 171 (H) 05/07/2020 1446   BUN 13 05/07/2020 1446   CREATININE 0.96 05/07/2020 1446   CALCIUM 9.7 05/07/2020 1446   GFRNONAA 79 05/07/2020 1446   GFRAA 92 05/07/2020 1446   No results found for: CHOL, HDL, LDLCALC, LDLDIRECT, TRIG, CHOLHDL No results found  for: HGBA1C No results found for: VITAMINB12 No results found for: TSH  PHYSICAL EXAM:  Vitals:   04/19/24 1039  BP: (!) 126/56  Pulse: 65  SpO2: 99%   No data found. Body mass index is 27.84 kg/m.   Wt Readings from Last 3 Encounters:  04/19/24 199 lb 9.6 oz (90.5 kg)  01/18/24 210 lb (95.3 kg)  09/08/23 213 lb (96.6 kg)     Ht Readings from Last 3 Encounters:  04/19/24 5' 11 (1.803 m)  01/18/24 5' 11 (1.803 m)  09/08/23 5' 10 (1.778 m)      General: The patient is awake, alert and appears not in acute distress and groomed. Head: Normocephalic, atraumatic.  Neck is supple.ASSESSMENT AND PLAN :   73 y.o. year old male  here with:    1) impaired STM , ordered dementia panel , start aricept .   2) PT for gait stabilization  3)  eye doctor for visual field testing.      I would like to thank Ruben Anes, MD and Ruben Anes, Md [address] [place],  [place] [address] for allowing me to meet with this pleasant patient.   Sleep Clinic Patients are generally offered input on sleep hygiene, life style changes and how to improve compliance with medical treatment where applicable. Review and reiteration of good sleep hygiene measures is offered to any sleep clinic patient, be it in the first consultation or with any follow up visits.    Any patient with sleepiness should be cautioned not to drive, work at heights, or operate dangerous or heavy equipment when feeling tired or sleepy.      The patient will be seen in follow-up in the sleep clinic at [facility] for discussion of test results, sleep related symptoms and treatment compliance review, further management strategies, etc.   The referring provider will be notified of the test results.   The patient's condition requires frequent monitoring and adjustments in the treatment plan, reflecting the ongoing complexity of care.  This provider is the continuing focal point for all needed services for this  condition.  After spending a total time of  29  minutes face to face and time for  history taking, physical and neurologic examination, review of laboratory studies,  personal review of imaging studies, reports and results of other testing and review of referral information / records as far as provided in visit,   Electronically signed by: Ruben Gores, MD 04/19/2024 11:36 AM  [facility] Board certified by The [employer] of Sleep Medicine and Diplomate of the [employer] of Sleep Medicine.  Board certified In Neurology through the ABPN, Fellow of the [employer] of Neu[employer].

## 2024-04-24 ENCOUNTER — Telehealth: Payer: Self-pay | Admitting: Neurology

## 2024-04-24 NOTE — Telephone Encounter (Signed)
 Referral ophthalmology fax to [facility] to see Dr. Dene Etienne. Phone: [phone], Fax: [phone]

## 2024-04-28 ENCOUNTER — Ambulatory Visit: Payer: Self-pay | Admitting: Neurology

## 2024-04-28 LAB — CBC WITH DIFFERENTIAL/PLATELET
Basophils Absolute: 0 x10E3/uL (ref 0.0–0.2)
Basos: 1 %
EOS (ABSOLUTE): 0.2 x10E3/uL (ref 0.0–0.4)
Eos: 4 %
Hematocrit: 42.6 % (ref 37.5–51.0)
Hemoglobin: 14.1 g/dL (ref 13.0–17.7)
Immature Grans (Abs): 0 x10E3/uL (ref 0.0–0.1)
Immature Granulocytes: 0 %
Lymphocytes Absolute: 1.2 x10E3/uL (ref 0.7–3.1)
Lymphs: 24 %
MCH: 32.2 pg (ref 26.6–33.0)
MCHC: 33.1 g/dL (ref 31.5–35.7)
MCV: 97 fL (ref 79–97)
Monocytes Absolute: 0.4 x10E3/uL (ref 0.1–0.9)
Monocytes: 9 %
Neutrophils Absolute: 3 x10E3/uL (ref 1.4–7.0)
Neutrophils: 62 %
Platelets: 238 x10E3/uL (ref 150–450)
RBC: 4.38 x10E6/uL (ref 4.14–5.80)
RDW: 12.3 % (ref 11.6–15.4)
WBC: 4.9 x10E3/uL (ref 3.4–10.8)

## 2024-04-28 LAB — PROTEIN ELECTROPHORESIS, SERUM
A/G Ratio: 1.4 (ref 0.7–1.7)
Albumin ELP: 3.7 g/dL (ref 2.9–4.4)
Alpha 1: 0.2 g/dL (ref 0.0–0.4)
Alpha 2: 0.8 g/dL (ref 0.4–1.0)
Beta: 0.9 g/dL (ref 0.7–1.3)
Gamma Globulin: 0.8 g/dL (ref 0.4–1.8)
Globulin, Total: 2.7 g/dL (ref 2.2–3.9)

## 2024-04-28 LAB — COMPREHENSIVE METABOLIC PANEL WITH GFR
ALT: 13 IU/L (ref 0–44)
AST: 17 IU/L (ref 0–40)
Albumin: 4.3 g/dL (ref 3.8–4.8)
Alkaline Phosphatase: 66 IU/L (ref 47–123)
BUN/Creatinine Ratio: 10 (ref 10–24)
BUN: 8 mg/dL (ref 8–27)
Bilirubin Total: 0.3 mg/dL (ref 0.0–1.2)
CO2: 24 mmol/L (ref 20–29)
Calcium: 9.7 mg/dL (ref 8.6–10.2)
Chloride: 104 mmol/L (ref 96–106)
Creatinine, Ser: 0.77 mg/dL (ref 0.76–1.27)
Globulin, Total: 2.1 g/dL (ref 1.5–4.5)
Glucose: 176 mg/dL — ABNORMAL HIGH (ref 70–99)
Potassium: 4.4 mmol/L (ref 3.5–5.2)
Sodium: 140 mmol/L (ref 134–144)
Total Protein: 6.4 g/dL (ref 6.0–8.5)
eGFR: 93 mL/min/1.73 (ref >59)

## 2024-04-28 LAB — ATN PROFILE
A -- Beta-amyloid 42/40 Ratio: 0.092 — ABNORMAL LOW (ref >0.102)
Beta-amyloid 40: 183.85 pg/mL
Beta-amyloid 42: 16.98 pg/mL
N -- NfL, Plasma: 7.25 pg/mL — ABNORMAL HIGH (ref 0.00–6.04)
T -- p-tau181: 1.21 pg/mL — ABNORMAL HIGH (ref 0.00–0.97)

## 2024-04-28 LAB — HEMOGLOBIN A1C
Est. average glucose Bld gHb Est-mCnc: 177 mg/dL
Hgb A1c MFr Bld: 7.8 % — ABNORMAL HIGH (ref 4.8–5.6)

## 2024-04-28 LAB — SEDIMENTATION RATE: Sed Rate: 4 mm/h (ref 0–30)

## 2024-04-28 LAB — APOE ALZHEIMER'S DISEASE RISK

## 2024-04-28 LAB — TSH+FREE T4
Free T4: 1 ng/dL (ref 0.82–1.77)
TSH: 0.588 u[IU]/mL (ref 0.450–4.500)

## 2024-04-28 LAB — ANA W/REFLEX: Anti Nuclear Antibody (ANA): NEGATIVE

## 2024-04-28 LAB — VITAMIN B12: Vitamin B-12: 556 pg/mL (ref 232–1245)

## 2024-05-11 NOTE — Telephone Encounter (Signed)
 Pt returned call. I was able to go over the results of his labs, and phone message per below per Dr. Chalice.  He was not sure if he had visual fields will call his eye doc to follow up on this.  He was waiting for PT to start after first of year.  He has appt  in 09-21-2024 placed on waitilist.   Neurofilaments ( NFL)  will increase temporarily after a stroke, a bleed, a trauma to the brain, radiation , meningeal infection-  here the finding is explained by a small stroke that on MRI was dated to be less than 4 months old.  The stroke location accounts for impairment of spatial awareness,  abnormal visual field ( blind in a quarter of the visual field) , and may cause impaired auditory and reading  comprehension - word processing may be affected too. ( Transcortical sensory aphasia) . I like to follow up with the patient to discuss additional  therapies :  speech, vision,  (  PT especially for gait stability had been ordered on 01-18-2024 already. )   I left a VM with Ruben Logan asking him to share how PT went, who is eye doctor is and how he feels about  speech therapy.    He should also have his visual fields checked by an eye clinic - please ask him who his ophthalmologist is ( as a diabetic he has likely regular check ups) .

## 2024-05-13 ENCOUNTER — Other Ambulatory Visit: Payer: Self-pay | Admitting: Neurology

## 2024-08-01 ENCOUNTER — Ambulatory Visit: Admitting: Neurology

## 2024-08-15 ENCOUNTER — Ambulatory Visit: Admitting: Neurology

## 2024-09-21 ENCOUNTER — Ambulatory Visit: Admitting: Neurology
